# Patient Record
Sex: Male | Born: 1955 | Race: Black or African American | Hispanic: No | Marital: Single | Smoking: Current every day smoker
Health system: Southern US, Community
[De-identification: ages and names within clinical notes are randomized; demographics above are authoritative.]

## PROBLEM LIST (undated history)

## (undated) DIAGNOSIS — R634 Abnormal weight loss: Secondary | ICD-10-CM

## (undated) DIAGNOSIS — C419 Malignant neoplasm of bone and articular cartilage, unspecified: Secondary | ICD-10-CM

## (undated) DIAGNOSIS — C61 Malignant neoplasm of prostate: Secondary | ICD-10-CM

## (undated) DIAGNOSIS — E881 Lipodystrophy, not elsewhere classified: Secondary | ICD-10-CM

## (undated) DIAGNOSIS — R972 Elevated prostate specific antigen [PSA]: Secondary | ICD-10-CM

## (undated) DIAGNOSIS — T375X1A Poisoning by antiviral drugs, accidental (unintentional), initial encounter: Secondary | ICD-10-CM

## (undated) DIAGNOSIS — F32A Depression, unspecified: Secondary | ICD-10-CM

## (undated) DIAGNOSIS — E785 Hyperlipidemia, unspecified: Secondary | ICD-10-CM

## (undated) DIAGNOSIS — B20 Human immunodeficiency virus [HIV] disease: Secondary | ICD-10-CM

## (undated) DIAGNOSIS — G629 Polyneuropathy, unspecified: Secondary | ICD-10-CM

## (undated) DIAGNOSIS — F329 Major depressive disorder, single episode, unspecified: Secondary | ICD-10-CM

## (undated) HISTORY — DX: Human immunodeficiency virus (HIV) disease: B20

## (undated) HISTORY — DX: Abnormal weight loss: R63.4

## (undated) HISTORY — DX: Poisoning by antiviral drugs, accidental (unintentional), initial encounter: E88.1

## (undated) HISTORY — DX: Hyperlipidemia, unspecified: E78.5

## (undated) HISTORY — DX: Poisoning by antiviral drugs, accidental (unintentional), initial encounter: T37.5X1A

## (undated) HISTORY — DX: Major depressive disorder, single episode, unspecified: F32.9

## (undated) HISTORY — DX: Elevated prostate specific antigen (PSA): R97.20

## (undated) HISTORY — DX: Depression, unspecified: F32.A

## (undated) HISTORY — DX: Polyneuropathy, unspecified: G62.9

## (undated) HISTORY — PX: OTHER SURGICAL HISTORY: SHX169

---

## 1997-10-04 ENCOUNTER — Encounter: Admission: RE | Admit: 1997-10-04 | Discharge: 1997-10-04 | Payer: Self-pay | Admitting: Internal Medicine

## 1998-01-18 ENCOUNTER — Encounter: Admission: RE | Admit: 1998-01-18 | Discharge: 1998-01-18 | Payer: Self-pay | Admitting: Internal Medicine

## 1998-01-18 ENCOUNTER — Ambulatory Visit (HOSPITAL_COMMUNITY): Admission: RE | Admit: 1998-01-18 | Discharge: 1998-01-18 | Payer: Self-pay | Admitting: Internal Medicine

## 1998-03-09 ENCOUNTER — Encounter: Admission: RE | Admit: 1998-03-09 | Discharge: 1998-03-09 | Payer: Self-pay | Admitting: Internal Medicine

## 1998-07-14 ENCOUNTER — Ambulatory Visit (HOSPITAL_COMMUNITY): Admission: RE | Admit: 1998-07-14 | Discharge: 1998-07-14 | Payer: Self-pay | Admitting: Hematology and Oncology

## 1998-07-14 ENCOUNTER — Encounter: Admission: RE | Admit: 1998-07-14 | Discharge: 1998-07-14 | Payer: Self-pay | Admitting: Internal Medicine

## 1998-09-20 ENCOUNTER — Encounter: Admission: RE | Admit: 1998-09-20 | Discharge: 1998-09-20 | Payer: Self-pay | Admitting: Internal Medicine

## 1998-11-28 ENCOUNTER — Encounter: Admission: RE | Admit: 1998-11-28 | Discharge: 1998-11-28 | Payer: Self-pay | Admitting: Internal Medicine

## 1998-11-28 ENCOUNTER — Ambulatory Visit (HOSPITAL_COMMUNITY): Admission: RE | Admit: 1998-11-28 | Discharge: 1998-11-28 | Payer: Self-pay | Admitting: Internal Medicine

## 1999-03-27 ENCOUNTER — Ambulatory Visit (HOSPITAL_COMMUNITY): Admission: RE | Admit: 1999-03-27 | Discharge: 1999-03-27 | Payer: Self-pay | Admitting: Internal Medicine

## 1999-03-27 ENCOUNTER — Encounter: Admission: RE | Admit: 1999-03-27 | Discharge: 1999-03-27 | Payer: Self-pay | Admitting: Internal Medicine

## 1999-07-17 ENCOUNTER — Ambulatory Visit (HOSPITAL_COMMUNITY): Admission: RE | Admit: 1999-07-17 | Discharge: 1999-07-17 | Payer: Self-pay | Admitting: Hematology and Oncology

## 1999-07-17 ENCOUNTER — Encounter: Admission: RE | Admit: 1999-07-17 | Discharge: 1999-07-17 | Payer: Self-pay | Admitting: Hematology and Oncology

## 1999-08-28 ENCOUNTER — Encounter: Admission: RE | Admit: 1999-08-28 | Discharge: 1999-08-28 | Payer: Self-pay | Admitting: Internal Medicine

## 1999-09-27 ENCOUNTER — Encounter: Admission: RE | Admit: 1999-09-27 | Discharge: 1999-09-27 | Payer: Self-pay | Admitting: Internal Medicine

## 1999-09-27 ENCOUNTER — Ambulatory Visit (HOSPITAL_COMMUNITY): Admission: RE | Admit: 1999-09-27 | Discharge: 1999-09-27 | Payer: Self-pay | Admitting: Internal Medicine

## 1999-11-20 ENCOUNTER — Encounter: Admission: RE | Admit: 1999-11-20 | Discharge: 1999-11-20 | Payer: Self-pay | Admitting: Hematology and Oncology

## 2000-01-10 ENCOUNTER — Encounter: Admission: RE | Admit: 2000-01-10 | Discharge: 2000-01-10 | Payer: Self-pay | Admitting: Hematology and Oncology

## 2000-01-10 ENCOUNTER — Ambulatory Visit (HOSPITAL_COMMUNITY): Admission: RE | Admit: 2000-01-10 | Discharge: 2000-01-10 | Payer: Self-pay | Admitting: Hematology and Oncology

## 2000-03-06 ENCOUNTER — Ambulatory Visit (HOSPITAL_COMMUNITY): Admission: RE | Admit: 2000-03-06 | Discharge: 2000-03-06 | Payer: Self-pay | Admitting: Internal Medicine

## 2000-03-06 ENCOUNTER — Encounter: Admission: RE | Admit: 2000-03-06 | Discharge: 2000-03-06 | Payer: Self-pay | Admitting: Internal Medicine

## 2000-07-08 ENCOUNTER — Encounter: Admission: RE | Admit: 2000-07-08 | Discharge: 2000-07-08 | Payer: Self-pay | Admitting: Hematology and Oncology

## 2000-07-08 ENCOUNTER — Ambulatory Visit (HOSPITAL_COMMUNITY): Admission: RE | Admit: 2000-07-08 | Discharge: 2000-07-08 | Payer: Self-pay | Admitting: Hematology and Oncology

## 2000-07-30 ENCOUNTER — Encounter: Admission: RE | Admit: 2000-07-30 | Discharge: 2000-07-30 | Payer: Self-pay | Admitting: Internal Medicine

## 2000-11-25 ENCOUNTER — Encounter: Admission: RE | Admit: 2000-11-25 | Discharge: 2000-11-25 | Payer: Self-pay | Admitting: Internal Medicine

## 2000-11-25 ENCOUNTER — Ambulatory Visit (HOSPITAL_COMMUNITY): Admission: RE | Admit: 2000-11-25 | Discharge: 2000-11-25 | Payer: Self-pay | Admitting: Internal Medicine

## 2001-04-23 ENCOUNTER — Ambulatory Visit (HOSPITAL_COMMUNITY): Admission: RE | Admit: 2001-04-23 | Discharge: 2001-04-23 | Payer: Self-pay

## 2001-04-23 ENCOUNTER — Encounter: Admission: RE | Admit: 2001-04-23 | Discharge: 2001-04-23 | Payer: Self-pay

## 2001-07-20 ENCOUNTER — Ambulatory Visit (HOSPITAL_COMMUNITY): Admission: RE | Admit: 2001-07-20 | Discharge: 2001-07-20 | Payer: Self-pay | Admitting: Internal Medicine

## 2001-07-20 ENCOUNTER — Encounter: Admission: RE | Admit: 2001-07-20 | Discharge: 2001-07-20 | Payer: Self-pay | Admitting: Internal Medicine

## 2001-08-03 ENCOUNTER — Encounter: Admission: RE | Admit: 2001-08-03 | Discharge: 2001-08-03 | Payer: Self-pay | Admitting: Internal Medicine

## 2001-08-03 ENCOUNTER — Ambulatory Visit (HOSPITAL_COMMUNITY): Admission: RE | Admit: 2001-08-03 | Discharge: 2001-08-03 | Payer: Self-pay

## 2001-11-30 ENCOUNTER — Encounter: Admission: RE | Admit: 2001-11-30 | Discharge: 2001-11-30 | Payer: Self-pay | Admitting: Internal Medicine

## 2001-11-30 ENCOUNTER — Ambulatory Visit (HOSPITAL_COMMUNITY): Admission: RE | Admit: 2001-11-30 | Discharge: 2001-11-30 | Payer: Self-pay | Admitting: Internal Medicine

## 2001-12-01 ENCOUNTER — Encounter: Admission: RE | Admit: 2001-12-01 | Discharge: 2001-12-01 | Payer: Self-pay | Admitting: Internal Medicine

## 2002-03-01 ENCOUNTER — Ambulatory Visit (HOSPITAL_COMMUNITY): Admission: RE | Admit: 2002-03-01 | Discharge: 2002-03-01 | Payer: Self-pay | Admitting: Internal Medicine

## 2002-03-01 ENCOUNTER — Encounter: Admission: RE | Admit: 2002-03-01 | Discharge: 2002-03-01 | Payer: Self-pay | Admitting: Internal Medicine

## 2002-06-28 ENCOUNTER — Encounter: Admission: RE | Admit: 2002-06-28 | Discharge: 2002-06-28 | Payer: Self-pay | Admitting: Internal Medicine

## 2002-11-24 ENCOUNTER — Encounter: Admission: RE | Admit: 2002-11-24 | Discharge: 2002-11-24 | Payer: Self-pay | Admitting: Internal Medicine

## 2002-11-25 ENCOUNTER — Encounter (INDEPENDENT_AMBULATORY_CARE_PROVIDER_SITE_OTHER): Payer: Self-pay | Admitting: Internal Medicine

## 2002-11-25 ENCOUNTER — Encounter: Admission: RE | Admit: 2002-11-25 | Discharge: 2002-11-25 | Payer: Self-pay | Admitting: Internal Medicine

## 2002-11-25 ENCOUNTER — Ambulatory Visit (HOSPITAL_COMMUNITY): Admission: RE | Admit: 2002-11-25 | Discharge: 2002-11-25 | Payer: Self-pay | Admitting: Internal Medicine

## 2003-01-17 ENCOUNTER — Encounter: Admission: RE | Admit: 2003-01-17 | Discharge: 2003-01-17 | Payer: Self-pay | Admitting: Internal Medicine

## 2003-05-10 ENCOUNTER — Encounter: Admission: RE | Admit: 2003-05-10 | Discharge: 2003-05-10 | Payer: Self-pay | Admitting: Internal Medicine

## 2003-05-10 ENCOUNTER — Ambulatory Visit (HOSPITAL_COMMUNITY): Admission: RE | Admit: 2003-05-10 | Discharge: 2003-05-10 | Payer: Self-pay | Admitting: Internal Medicine

## 2003-05-12 ENCOUNTER — Encounter: Admission: RE | Admit: 2003-05-12 | Discharge: 2003-05-12 | Payer: Self-pay | Admitting: Internal Medicine

## 2003-09-13 ENCOUNTER — Encounter: Admission: RE | Admit: 2003-09-13 | Discharge: 2003-09-13 | Payer: Self-pay | Admitting: Internal Medicine

## 2003-09-13 ENCOUNTER — Ambulatory Visit (HOSPITAL_COMMUNITY): Admission: RE | Admit: 2003-09-13 | Discharge: 2003-09-13 | Payer: Self-pay | Admitting: Internal Medicine

## 2003-10-11 ENCOUNTER — Encounter: Admission: RE | Admit: 2003-10-11 | Discharge: 2003-10-11 | Payer: Self-pay | Admitting: Internal Medicine

## 2003-12-15 ENCOUNTER — Ambulatory Visit: Payer: Self-pay | Admitting: Internal Medicine

## 2003-12-15 ENCOUNTER — Ambulatory Visit (HOSPITAL_COMMUNITY): Admission: RE | Admit: 2003-12-15 | Discharge: 2003-12-15 | Payer: Self-pay | Admitting: Internal Medicine

## 2004-03-21 ENCOUNTER — Ambulatory Visit (HOSPITAL_COMMUNITY): Admission: RE | Admit: 2004-03-21 | Discharge: 2004-03-21 | Payer: Self-pay | Admitting: Internal Medicine

## 2004-03-21 ENCOUNTER — Encounter (INDEPENDENT_AMBULATORY_CARE_PROVIDER_SITE_OTHER): Payer: Self-pay | Admitting: Internal Medicine

## 2004-03-21 ENCOUNTER — Ambulatory Visit: Payer: Self-pay | Admitting: Internal Medicine

## 2005-01-29 ENCOUNTER — Ambulatory Visit (HOSPITAL_COMMUNITY): Admission: RE | Admit: 2005-01-29 | Discharge: 2005-01-29 | Payer: Self-pay | Admitting: Internal Medicine

## 2005-01-29 ENCOUNTER — Ambulatory Visit: Payer: Self-pay | Admitting: Internal Medicine

## 2005-04-30 ENCOUNTER — Ambulatory Visit: Payer: Self-pay | Admitting: Hospitalist

## 2005-04-30 ENCOUNTER — Ambulatory Visit: Payer: Self-pay | Admitting: Infectious Diseases

## 2005-04-30 ENCOUNTER — Ambulatory Visit (HOSPITAL_COMMUNITY): Admission: RE | Admit: 2005-04-30 | Discharge: 2005-04-30 | Payer: Self-pay | Admitting: Hospitalist

## 2005-10-31 ENCOUNTER — Ambulatory Visit: Payer: Self-pay | Admitting: Hospitalist

## 2005-10-31 ENCOUNTER — Encounter: Admission: RE | Admit: 2005-10-31 | Discharge: 2005-10-31 | Payer: Self-pay | Admitting: Hospitalist

## 2006-02-17 DIAGNOSIS — L738 Other specified follicular disorders: Secondary | ICD-10-CM | POA: Insufficient documentation

## 2006-02-17 DIAGNOSIS — A5139 Other secondary syphilis of skin: Secondary | ICD-10-CM

## 2006-02-17 DIAGNOSIS — B20 Human immunodeficiency virus [HIV] disease: Secondary | ICD-10-CM

## 2006-04-15 DIAGNOSIS — D649 Anemia, unspecified: Secondary | ICD-10-CM

## 2006-04-30 ENCOUNTER — Telehealth (INDEPENDENT_AMBULATORY_CARE_PROVIDER_SITE_OTHER): Payer: Self-pay | Admitting: *Deleted

## 2006-07-08 ENCOUNTER — Encounter: Admission: RE | Admit: 2006-07-08 | Discharge: 2006-07-08 | Payer: Self-pay | Admitting: Internal Medicine

## 2006-07-08 ENCOUNTER — Encounter (INDEPENDENT_AMBULATORY_CARE_PROVIDER_SITE_OTHER): Payer: Self-pay | Admitting: Internal Medicine

## 2006-07-08 ENCOUNTER — Ambulatory Visit: Payer: Self-pay | Admitting: Internal Medicine

## 2006-07-08 DIAGNOSIS — F17200 Nicotine dependence, unspecified, uncomplicated: Secondary | ICD-10-CM

## 2006-07-08 DIAGNOSIS — E785 Hyperlipidemia, unspecified: Secondary | ICD-10-CM

## 2006-07-10 ENCOUNTER — Encounter (INDEPENDENT_AMBULATORY_CARE_PROVIDER_SITE_OTHER): Payer: Self-pay | Admitting: *Deleted

## 2006-07-14 ENCOUNTER — Encounter (INDEPENDENT_AMBULATORY_CARE_PROVIDER_SITE_OTHER): Payer: Self-pay | Admitting: Internal Medicine

## 2006-07-14 ENCOUNTER — Ambulatory Visit: Payer: Self-pay | Admitting: Internal Medicine

## 2006-07-15 LAB — CONVERTED CEMR LAB: Vitamin B-12: 494 pg/mL (ref 211–911)

## 2006-07-30 ENCOUNTER — Telehealth (INDEPENDENT_AMBULATORY_CARE_PROVIDER_SITE_OTHER): Payer: Self-pay | Admitting: *Deleted

## 2006-10-27 ENCOUNTER — Telehealth (INDEPENDENT_AMBULATORY_CARE_PROVIDER_SITE_OTHER): Payer: Self-pay | Admitting: *Deleted

## 2006-10-30 ENCOUNTER — Encounter: Admission: RE | Admit: 2006-10-30 | Discharge: 2006-10-30 | Payer: Self-pay | Admitting: *Deleted

## 2006-10-30 ENCOUNTER — Ambulatory Visit: Payer: Self-pay | Admitting: *Deleted

## 2006-10-30 ENCOUNTER — Encounter (INDEPENDENT_AMBULATORY_CARE_PROVIDER_SITE_OTHER): Payer: Self-pay | Admitting: Internal Medicine

## 2006-10-30 LAB — CONVERTED CEMR LAB: HIV-1 RNA Quant, Log: <1.7 (ref <1.70)

## 2006-11-04 ENCOUNTER — Telehealth: Payer: Self-pay | Admitting: *Deleted

## 2007-01-26 ENCOUNTER — Telehealth (INDEPENDENT_AMBULATORY_CARE_PROVIDER_SITE_OTHER): Payer: Self-pay | Admitting: *Deleted

## 2007-05-06 ENCOUNTER — Ambulatory Visit: Payer: Self-pay | Admitting: Internal Medicine

## 2007-05-06 ENCOUNTER — Encounter (INDEPENDENT_AMBULATORY_CARE_PROVIDER_SITE_OTHER): Payer: Self-pay | Admitting: Internal Medicine

## 2007-05-06 ENCOUNTER — Encounter: Admission: RE | Admit: 2007-05-06 | Discharge: 2007-05-06 | Payer: Self-pay | Admitting: Internal Medicine

## 2007-05-06 LAB — CONVERTED CEMR LAB
ALT: 9 units/L (ref 0–53)
AST: 11 units/L (ref 0–37)
Albumin: 4.4 g/dL (ref 3.5–5.2)
Alkaline Phosphatase: 77 units/L (ref 39–117)
BUN: 16 mg/dL (ref 6–23)
Basophils Absolute: 0 10*3/uL (ref 0.0–0.1)
Basophils Relative: 0 % (ref 0–1)
CO2: 24 meq/L (ref 19–32)
Calcium: 9.3 mg/dL (ref 8.4–10.5)
Chloride: 107 meq/L (ref 96–112)
Creatinine, Ser: 0.87 mg/dL (ref 0.40–1.50)
Eosinophils Absolute: 0 10*3/uL (ref 0.0–0.7)
Eosinophils Relative: 1 % (ref 0–5)
Glucose, Bld: 86 mg/dL (ref 70–99)
HCT: 43 % (ref 39.0–52.0)
HIV 1 RNA Quant: 128 copies/mL — ABNORMAL HIGH (ref <50)
HIV-1 RNA Quant, Log: 2.11 — ABNORMAL HIGH (ref <1.70)
Hemoglobin: 15.3 g/dL (ref 13.0–17.0)
Lymphocytes Relative: 56 % — ABNORMAL HIGH (ref 12–46)
Lymphs Abs: 2.6 10*3/uL (ref 0.7–4.0)
MCHC: 35.6 g/dL (ref 30.0–36.0)
MCV: 109.4 fL — ABNORMAL HIGH (ref 78.0–100.0)
Monocytes Absolute: 0.6 10*3/uL (ref 0.1–1.0)
Monocytes Relative: 13 % — ABNORMAL HIGH (ref 3–12)
Neutro Abs: 1.4 10*3/uL — ABNORMAL LOW (ref 1.7–7.7)
Neutrophils Relative %: 30 % — ABNORMAL LOW (ref 43–77)
Platelets: 237 10*3/uL (ref 150–400)
Potassium: 4.2 meq/L (ref 3.5–5.3)
RBC: 3.93 M/uL — ABNORMAL LOW (ref 4.22–5.81)
RDW: 14.1 % (ref 11.5–15.5)
Sodium: 142 meq/L (ref 135–145)
Total Bilirubin: 0.6 mg/dL (ref 0.3–1.2)
Total Protein: 6.9 g/dL (ref 6.0–8.3)
WBC: 4.7 10*3/uL (ref 4.0–10.5)

## 2007-05-21 ENCOUNTER — Ambulatory Visit: Payer: Self-pay | Admitting: Internal Medicine

## 2007-05-21 ENCOUNTER — Encounter (INDEPENDENT_AMBULATORY_CARE_PROVIDER_SITE_OTHER): Payer: Self-pay | Admitting: Internal Medicine

## 2007-05-23 LAB — CONVERTED CEMR LAB
HDL: 59 mg/dL (ref >39)
Hep B S Ab: NEGATIVE
LDL Cholesterol: 109 mg/dL — ABNORMAL HIGH (ref 0–99)
Total CHOL/HDL Ratio: 3.3
Triglycerides: 145 mg/dL (ref <150)

## 2007-05-26 ENCOUNTER — Encounter (INDEPENDENT_AMBULATORY_CARE_PROVIDER_SITE_OTHER): Payer: Self-pay | Admitting: Internal Medicine

## 2007-05-29 ENCOUNTER — Ambulatory Visit: Payer: Self-pay | Admitting: Internal Medicine

## 2007-06-11 ENCOUNTER — Encounter (INDEPENDENT_AMBULATORY_CARE_PROVIDER_SITE_OTHER): Payer: Self-pay | Admitting: *Deleted

## 2007-07-14 ENCOUNTER — Encounter (INDEPENDENT_AMBULATORY_CARE_PROVIDER_SITE_OTHER): Payer: Self-pay | Admitting: Internal Medicine

## 2007-09-30 ENCOUNTER — Telehealth (INDEPENDENT_AMBULATORY_CARE_PROVIDER_SITE_OTHER): Payer: Self-pay | Admitting: Internal Medicine

## 2007-10-20 ENCOUNTER — Telehealth (INDEPENDENT_AMBULATORY_CARE_PROVIDER_SITE_OTHER): Payer: Self-pay | Admitting: Internal Medicine

## 2007-11-12 ENCOUNTER — Encounter (INDEPENDENT_AMBULATORY_CARE_PROVIDER_SITE_OTHER): Payer: Self-pay | Admitting: Internal Medicine

## 2007-11-12 ENCOUNTER — Ambulatory Visit: Payer: Self-pay | Admitting: *Deleted

## 2007-11-12 ENCOUNTER — Encounter: Admission: RE | Admit: 2007-11-12 | Discharge: 2007-11-12 | Payer: Self-pay | Admitting: *Deleted

## 2007-11-12 DIAGNOSIS — R634 Abnormal weight loss: Secondary | ICD-10-CM | POA: Insufficient documentation

## 2007-11-12 LAB — CONVERTED CEMR LAB
Alkaline Phosphatase: 79 units/L (ref 39–117)
CO2: 22 meq/L (ref 19–32)
Creatinine, Ser: 0.94 mg/dL (ref 0.40–1.50)
Eosinophils Absolute: 0 10*3/uL (ref 0.0–0.7)
Eosinophils Relative: 0 % (ref 0–5)
GC Probe Amp, Urine: NEGATIVE
Glucose, Bld: 88 mg/dL (ref 70–99)
HCT: 45.6 % (ref 39.0–52.0)
HIV 1 RNA Quant: <50 copies/mL (ref <50)
HIV-1 RNA Quant, Log: <1.7 (ref <1.70)
Hemoglobin: 15.9 g/dL (ref 13.0–17.0)
Leukocytes, UA: NEGATIVE
Lymphocytes Relative: 39 % (ref 12–46)
Lymphs Abs: 2 10*3/uL (ref 0.7–4.0)
MCV: 109.9 fL — ABNORMAL HIGH (ref 78.0–100.0)
Monocytes Absolute: 0.5 10*3/uL (ref 0.1–1.0)
Nitrite: NEGATIVE
RDW: 14.9 % (ref 11.5–15.5)
Specific Gravity, Urine: 1.034 — ABNORMAL HIGH (ref 1.005–1.03)
Total Bilirubin: 0.5 mg/dL (ref 0.3–1.2)
pH: 6 (ref 5.0–8.0)

## 2007-11-18 ENCOUNTER — Ambulatory Visit: Payer: Self-pay | Admitting: Infectious Diseases

## 2007-11-18 ENCOUNTER — Encounter (INDEPENDENT_AMBULATORY_CARE_PROVIDER_SITE_OTHER): Payer: Self-pay | Admitting: Internal Medicine

## 2007-11-19 LAB — CONVERTED CEMR LAB: Cholesterol: 222 mg/dL — ABNORMAL HIGH (ref 0–200)

## 2007-11-30 ENCOUNTER — Encounter (INDEPENDENT_AMBULATORY_CARE_PROVIDER_SITE_OTHER): Payer: Self-pay | Admitting: Internal Medicine

## 2007-11-30 ENCOUNTER — Ambulatory Visit: Payer: Self-pay | Admitting: Internal Medicine

## 2007-12-01 ENCOUNTER — Telehealth: Payer: Self-pay | Admitting: *Deleted

## 2007-12-01 LAB — CONVERTED CEMR LAB
HDL: 50 mg/dL (ref >39)
LDL Cholesterol: 83 mg/dL (ref 0–99)
Total CHOL/HDL Ratio: 3.5
Triglycerides: 222 mg/dL — ABNORMAL HIGH (ref <150)
VLDL: 44 mg/dL — ABNORMAL HIGH (ref 0–40)

## 2007-12-09 ENCOUNTER — Encounter (INDEPENDENT_AMBULATORY_CARE_PROVIDER_SITE_OTHER): Payer: Self-pay | Admitting: Internal Medicine

## 2007-12-15 ENCOUNTER — Telehealth (INDEPENDENT_AMBULATORY_CARE_PROVIDER_SITE_OTHER): Payer: Self-pay | Admitting: Internal Medicine

## 2008-01-11 ENCOUNTER — Ambulatory Visit: Payer: Self-pay | Admitting: Internal Medicine

## 2008-04-26 ENCOUNTER — Encounter (INDEPENDENT_AMBULATORY_CARE_PROVIDER_SITE_OTHER): Payer: Self-pay | Admitting: Internal Medicine

## 2008-05-03 ENCOUNTER — Encounter (INDEPENDENT_AMBULATORY_CARE_PROVIDER_SITE_OTHER): Payer: Self-pay | Admitting: Internal Medicine

## 2008-06-14 ENCOUNTER — Encounter (INDEPENDENT_AMBULATORY_CARE_PROVIDER_SITE_OTHER): Payer: Self-pay | Admitting: Internal Medicine

## 2008-06-14 ENCOUNTER — Ambulatory Visit: Payer: Self-pay | Admitting: Internal Medicine

## 2008-06-14 DIAGNOSIS — K029 Dental caries, unspecified: Secondary | ICD-10-CM | POA: Insufficient documentation

## 2008-06-14 LAB — CONVERTED CEMR LAB
ALT: 15 units/L (ref 0–53)
AST: 20 units/L (ref 0–37)
Calcium: 9.6 mg/dL (ref 8.4–10.5)
Chloride: 102 meq/L (ref 96–112)
Cocaine Metabolites: NEGATIVE
Creatinine, Ser: 0.86 mg/dL (ref 0.40–1.50)
HCT: 43.2 % (ref 39.0–52.0)
Marijuana Metabolite: POSITIVE — AB
Opiates: NEGATIVE
Phencyclidine (PCP): NEGATIVE
Platelets: 326 10*3/uL (ref 150–400)
Propoxyphene: NEGATIVE
RDW: 15.8 % — ABNORMAL HIGH (ref 11.5–15.5)

## 2008-06-20 ENCOUNTER — Encounter (INDEPENDENT_AMBULATORY_CARE_PROVIDER_SITE_OTHER): Payer: Self-pay | Admitting: Internal Medicine

## 2008-06-20 ENCOUNTER — Ambulatory Visit: Payer: Self-pay | Admitting: Internal Medicine

## 2008-06-20 LAB — CONVERTED CEMR LAB
Cholesterol: 189 mg/dL (ref 0–200)
HDL: 63 mg/dL (ref >39)
Total CHOL/HDL Ratio: 3
Triglycerides: 95 mg/dL (ref <150)
VLDL: 19 mg/dL (ref 0–40)

## 2008-07-07 ENCOUNTER — Ambulatory Visit: Payer: Self-pay | Admitting: Infectious Disease

## 2008-07-15 ENCOUNTER — Encounter (INDEPENDENT_AMBULATORY_CARE_PROVIDER_SITE_OTHER): Payer: Self-pay | Admitting: Internal Medicine

## 2008-07-20 ENCOUNTER — Encounter (INDEPENDENT_AMBULATORY_CARE_PROVIDER_SITE_OTHER): Payer: Self-pay | Admitting: Internal Medicine

## 2008-12-09 ENCOUNTER — Encounter (INDEPENDENT_AMBULATORY_CARE_PROVIDER_SITE_OTHER): Payer: Self-pay | Admitting: *Deleted

## 2009-03-09 ENCOUNTER — Encounter: Payer: Self-pay | Admitting: Infectious Disease

## 2009-04-05 ENCOUNTER — Ambulatory Visit: Payer: Self-pay | Admitting: Infectious Disease

## 2009-04-05 LAB — CONVERTED CEMR LAB
Albumin: 4.4 g/dL (ref 3.5–5.2)
Alkaline Phosphatase: 78 units/L (ref 39–117)
BUN: 19 mg/dL (ref 6–23)
Basophils Absolute: 0 10*3/uL (ref 0.0–0.1)
Basophils Relative: 0 % (ref 0–1)
CO2: 21 meq/L (ref 19–32)
Chlamydia, Swab/Urine, PCR: NEGATIVE
Cholesterol: 210 mg/dL — ABNORMAL HIGH (ref 0–200)
Eosinophils Relative: 0 % (ref 0–5)
Glucose, Bld: 87 mg/dL (ref 70–99)
HCT: 42.9 % (ref 39.0–52.0)
HDL: 58 mg/dL (ref >39)
HIV 1 RNA Quant: 90 copies/mL — ABNORMAL HIGH (ref <48)
HIV-1 RNA Quant, Log: 1.95 — ABNORMAL HIGH (ref <1.68)
Hemoglobin: 15.1 g/dL (ref 13.0–17.0)
LDL Cholesterol: 125 mg/dL — ABNORMAL HIGH (ref 0–99)
MCHC: 35.2 g/dL (ref 30.0–36.0)
Monocytes Absolute: 0.5 10*3/uL (ref 0.1–1.0)
Potassium: 4.3 meq/L (ref 3.5–5.3)
RDW: 14.4 % (ref 11.5–15.5)
Total Bilirubin: 0.5 mg/dL (ref 0.3–1.2)
Triglycerides: 134 mg/dL (ref <150)

## 2009-05-18 ENCOUNTER — Encounter (INDEPENDENT_AMBULATORY_CARE_PROVIDER_SITE_OTHER): Payer: Self-pay | Admitting: *Deleted

## 2009-06-05 ENCOUNTER — Ambulatory Visit: Payer: Self-pay | Admitting: Infectious Disease

## 2009-06-05 LAB — CONVERTED CEMR LAB
ALT: 12 units/L (ref 0–53)
BUN: 12 mg/dL (ref 6–23)
CO2: 22 meq/L (ref 19–32)
Calcium: 9.2 mg/dL (ref 8.4–10.5)
Chloride: 106 meq/L (ref 96–112)
Creatinine, Ser: 0.95 mg/dL (ref 0.40–1.50)
Eosinophils Absolute: 0 10*3/uL (ref 0.0–0.7)
Eosinophils Relative: 0 % (ref 0–5)
Glucose, Bld: 101 mg/dL — ABNORMAL HIGH (ref 70–99)
HIV 1 RNA Quant: <48 copies/mL (ref <48)
HIV-1 RNA Quant, Log: <1.68 (ref <1.68)
Hemoglobin: 15.8 g/dL (ref 13.0–17.0)
Lymphs Abs: 1.6 10*3/uL (ref 0.7–4.0)
Monocytes Relative: 9 % (ref 3–12)
RDW: 14.7 % (ref 11.5–15.5)
Testosterone: 982.33 ng/dL — ABNORMAL HIGH (ref 350–890)

## 2009-06-19 ENCOUNTER — Ambulatory Visit: Payer: Self-pay | Admitting: Infectious Disease

## 2009-06-19 DIAGNOSIS — M79609 Pain in unspecified limb: Secondary | ICD-10-CM | POA: Insufficient documentation

## 2009-07-13 ENCOUNTER — Telehealth: Payer: Self-pay | Admitting: Infectious Disease

## 2009-07-27 ENCOUNTER — Encounter: Payer: Self-pay | Admitting: Infectious Disease

## 2009-12-06 ENCOUNTER — Ambulatory Visit: Payer: Self-pay | Admitting: Infectious Disease

## 2009-12-06 LAB — CONVERTED CEMR LAB
ALT: 10 units/L (ref 0–53)
AST: 16 units/L (ref 0–37)
Albumin: 4.5 g/dL (ref 3.5–5.2)
BUN: 17 mg/dL (ref 6–23)
Basophils Relative: 0 % (ref 0–1)
Calcium: 9.4 mg/dL (ref 8.4–10.5)
Chloride: 105 meq/L (ref 96–112)
HDL: 49 mg/dL (ref >39)
HIV 1 RNA Quant: <48 copies/mL (ref <48)
HIV-1 RNA Quant, Log: <1.68 (ref <1.68)
MCHC: 35 g/dL (ref 30.0–36.0)
Monocytes Relative: 13 % — ABNORMAL HIGH (ref 3–12)
Neutro Abs: 1.6 10*3/uL — ABNORMAL LOW (ref 1.7–7.7)
Neutrophils Relative %: 36 % — ABNORMAL LOW (ref 43–77)
Potassium: 4.4 meq/L (ref 3.5–5.3)
RBC: 3.85 M/uL — ABNORMAL LOW (ref 4.22–5.81)
WBC: 4.4 10*3/uL (ref 4.0–10.5)

## 2009-12-20 ENCOUNTER — Ambulatory Visit: Payer: Self-pay | Admitting: Infectious Disease

## 2009-12-20 LAB — CONVERTED CEMR LAB: TSH: 1.606 microintl units/mL (ref 0.350–4.500)

## 2010-01-18 ENCOUNTER — Telehealth (INDEPENDENT_AMBULATORY_CARE_PROVIDER_SITE_OTHER): Payer: Self-pay | Admitting: *Deleted

## 2010-01-30 ENCOUNTER — Encounter: Payer: Self-pay | Admitting: Infectious Disease

## 2010-05-02 ENCOUNTER — Encounter (INDEPENDENT_AMBULATORY_CARE_PROVIDER_SITE_OTHER): Payer: Self-pay | Admitting: *Deleted

## 2010-05-06 LAB — CONVERTED CEMR LAB
ALT: 11 units/L (ref 0–53)
AST: 18 units/L (ref 0–37)
Basophils Absolute: 0 10*3/uL (ref 0.0–0.1)
Basophils Relative: 0 % (ref 0–1)
CO2: 22 meq/L (ref 19–32)
Chloride: 107 meq/L (ref 96–112)
Cholesterol: 196 mg/dL (ref 0–200)
Creatinine, Ser: 0.88 mg/dL (ref 0.40–1.50)
Eosinophils Relative: 0 % (ref 0–5)
HIV 1 RNA Quant: 177 copies/mL — ABNORMAL HIGH (ref <50)
HIV-1 RNA Quant, Log: 2.25 — ABNORMAL HIGH (ref <1.70)
Lymphocytes Relative: 38 % (ref 12–46)
MCHC: 34.6 g/dL (ref 30.0–36.0)
Neutro Abs: 1.7 10*3/uL (ref 1.7–7.7)
Platelets: 312 10*3/uL (ref 150–400)
RDW: 15 % — ABNORMAL HIGH (ref 11.5–14.0)
Sodium: 141 meq/L (ref 135–145)
Total Bilirubin: 0.5 mg/dL (ref 0.3–1.2)
Total CHOL/HDL Ratio: 4
Total Protein: 7.4 g/dL (ref 6.0–8.3)
VLDL: 36 mg/dL (ref 0–40)

## 2010-05-08 NOTE — Medication Information (Signed)
Summary: Tax adviser   Imported By: Florinda Marker 07/27/2009 16:46:05  _____________________________________________________________________  External Attachment:    Type:   Image     Comment:   External Document

## 2010-05-08 NOTE — Progress Notes (Signed)
Summary: Zerit refill   Phone Note From Pharmacy   Caller: [facility]  Claris Gower [phone] Summary of Call: Requesting zerit refill.     New/Updated Medications: ZERIT 40 MG CAPS (STAVUDINE) take one two times daily Prescriptions: ZERIT 40 MG CAPS (STAVUDINE) take one two times daily  #60 x 6   Entered by:   Tomasita Morrow RN   Authorized by:   Acey Lav MD   Signed by:   Tomasita Morrow RN on 07/13/2009   Method used:   Electronically to        [facility])       [address]       [place], [place]  [address]       Ph: [phone]       Fax: [phone]   RxID:   [id number]

## 2010-05-08 NOTE — Progress Notes (Signed)
Summary: Marinol Prior Approval obtained  Phone Note From Pharmacy   Caller: [facility], Prior Authorization [phone] Summary of Call: Information provided and prior authorization for Marinol approved. Jennet Maduro RN  January 19, 2010 11:32 AM

## 2010-05-08 NOTE — Assessment & Plan Note (Signed)
Summary: F/U OV/VS   Visit Type:  Follow-up Referring Provider:  gherghe Primary Provider:  Paulette Blanch Dam  CC:  f/u labs and Lipid Management.  History of Present Illness: 55 yo with HIV perfectly controlled on his current regimen of zerit, epivir, prezista and norvir. He continues to have difficulty gaining weight. I reviewed his regimen and suggested change from zerit to isentress to get him off one toxic NRTI. Otherwise he appears to be doing well.  Lipid Management History:      Positive NCEP/ATP III risk factors include male age 73 years old or older and current tobacco user.  Negative NCEP/ATP III risk factors include non-diabetic, no family history for ischemic heart disease, non-hypertensive, no ASHD (atherosclerotic heart disease), no prior stroke/TIA, no peripheral vascular disease, and no history of aortic aneurysm.    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 Take One Two Times Daily 2)  Pravachol 40 Mg Tabs (Pravastatin Sodium) .... Once Daily 3)  Multivitamins   Tabs (Multiple Vitamin) .... Take 1 Tablet By Mouth Once A Day 4)  Prezista 400 Mg Tabs (Darunavir Ethanolate) .... Take Two Prezistas With One Norvir Once A Day 5)  Norvir 100 Mg Tabs (Ritonavir) .... Take 1 Tablet By Mouth Once A Day With Two Prezistas 6)  Epivir 300 Mg Tabs (Lamivudine) .... Take 1 Tablet By Mouth Once A Day  Current Medications (verified): 1)  Pravachol 40 Mg Tabs (Pravastatin Sodium) .... Once Daily 2)  Multivitamins   Tabs (Multiple Vitamin)  .... Take 1 Tablet By Mouth Once A Day 3)  Prezista 400 Mg Tabs (Darunavir Ethanolate) .... Take Two Prezistas With One Norvir Once A Day 4)  Norvir 100 Mg Tabs (Ritonavir) .... Take 1 Tablet By Mouth Once A Day With Two Prezistas 5)  Epivir 300 Mg Tabs (Lamivudine) .... Take 1 Tablet By Mouth Once A Day 6)  Marinol 2.5 Mg Caps (Dronabinol) .... Take 1 Tablet By Mouth Two Times A Day 7)  Isentress 400 Mg Tabs (Raltegravir Potassium) .... 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  Past Surgical History: Last updated: 06/19/2009 none  Family History: Last updated: Apr 23, 2009 father died 5 months ago with metastatic prostate cancer, he had CAD but not early CAD  Social History: Last updated: 02/17/2006 Domestic Partner Current Smoker Alcohol use-no Drug use-yes (need to clarify)  Risk Factors: Alcohol Use: 0 (06/19/2009) Caffeine Use: coffee and soda (06/19/2009) Exercise: yes (06/19/2009)  Risk Factors: Smoking Status: current (06/19/2009) Packs/Day: 0.5 (06/19/2009) Passive Smoke Exposure: No (06/19/2009)  Family History: Reviewed history from 04/23/09 and no changes required. father died 5 months ago with metastatic prostate cancer, he had CAD but not early CAD  Social History: Reviewed  history from 02/17/2006 and no changes required. Domestic Partner Current Smoker Alcohol use-no Drug use-yes (need to clarify)  Review of Systems  The patient denies anorexia, fever, weight loss, weight gain, vision loss, decreased hearing, hoarseness, chest pain, syncope, dyspnea on  exertion, peripheral edema, prolonged cough, headaches, hemoptysis, abdominal pain, melena, hematochezia, severe indigestion/heartburn, hematuria, incontinence, genital sores, muscle weakness, suspicious skin lesions, transient blindness, difficulty walking, depression, unusual weight change, abnormal bleeding, and enlarged lymph nodes.    Vital Signs:  Patient profile:   55 year old male Height:      71 inches (180.34 cm) Weight:      136.50 pounds (62.05 kg) BMI:     19.11 Temp:     98.2 degrees F (36.78 degrees C) oral Pulse rate:   97 / minute BP sitting:   130 / 89  (left arm)  Vitals Entered By: Starleen Arms CMA (December 20, 2009 9:18 AM) CC: f/u labs, Lipid Management Is Patient Diabetic? No Pain Assessment Patient in pain? no      Nutritional Status BMI of 19 -24 = normal Nutritional Status Detail nl  Does patient need assistance? Functional Status Self care Ambulation Normal        Medication Adherence: 12/20/2009   Adherence to medications reviewed with patient. Counseling to provide adequate adherence provided   Prevention For Positives: 12/20/2009   Safe sex practices discussed with patient. Condoms offered.   Education Materials Provided: 12/20/2009 Safe sex practices discussed with patient. Condoms offered.                          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   Ears:  no external deformities.   Nose:  no external deformity and no external erythema.   Mouth:  pharynx pink and moist, no erythema, and no exudates.   Neck:  supple and full ROM.   Lungs:  normal respiratory effort, no crackles, and no wheezes.   Heart:  normal rate, regular rhythm, no murmur, no gallop, and no rub.   Abdomen:  soft, non-tender, normal bowel sounds, and no distention.     Extremities:  No clubbing, cyanosis, edema, or deformity noted with normal full range of motion of all joints.   Neurologic:  alert & oriented X3, strength normal in all extremities, and gait normal.   Skin:  no rashes and no ecchymoses.   Psych:  Oriented X3, memory intact for recent and remote, and normally interactive.     Impression & Recommendations:  Problem # 1:  AIDS (ICD-042)  Will change him to raltegravir, 3tc, norvir and prezista, recheck vl and cd4 in onemonth, fu in 6 months otherwise  Orders: Est. Patient Level IV (25956)  Problem # 2:  TOBACCO ABUSE (ICD-305.1)  wiil continue to try to quit  Orders: Est. Patient Level IV (38756)  Problem # 3:  HYPERLIPIDEMIA (ICD-272.4)  continue stating for now His updated medication list for this problem includes:    Pravachol 40 Mg Tabs (Pravastatin sodium) ..... Once daily  Orders: Est. Patient Level IV (43329)  Problem # 4:  WEIGHT LOSS (JJO-841.66) will rx marinol Orders: T-TSH (06301-60109) T-T4, Free (32355-73220) Est. Patient Level IV (25427)  Medications Added to Medication List This Visit: 1)  Marinol 2.5 Mg Caps (Dronabinol) .... Take  1 tablet by mouth two times a day 2)  Isentress 400 Mg Tabs (Raltegravir potassium) .... Take 1 tablet by mouth two times a day  Other Orders: Hepatitis A Vaccine (Adult Dose) (13086) Admin 1st Vaccine (57846) Flu Vaccine 42yrs + (96295) Admin of Any Addtl Vaccine (28413) Future Orders: T-CD4SP ([facility]) (CD4SP) ... 06/18/2010 T-HIV Viral Load [phone]) ... 06/18/2010 T-CBC w/Diff ([id number]) ... 06/18/2010 T-Lipid Profile [phone]) ... 06/18/2010 T-RPR (Syphilis) [phone]) ... 06/18/2010 T-Comprehensive Metabolic Panel [phone]) ... 06/18/2010 T-CD4SP ([facility]) (CD4SP) ... 01/29/2010 T-HIV Viral Load [phone]) ... 01/29/2010  Lipid Assessment/Plan:      Based on NCEP/ATP III, the patient's risk factor category is "0-1 risk factors".  The  patient's lipid goals are as follows: Total cholesterol goal is 200; LDL cholesterol goal is 160; HDL cholesterol goal is 40; Triglyceride goal is 150.  His LDL cholesterol goal has been met.    Patient Instructions: 1)  Please schedule a follow-up appointment in 6 months. 2)  Advised not to eat any food or drink any liquids after 10 PM the night before labs 3)  Be sure to return for lab work one (1) week before your next appointment as scheduled.    Immunizations Administered:  Hepatitis A Vaccine # 2:    Vaccine Type: HepA    Site: right deltoid    Mfr: GlaxoSmithKline    Dose: 0.1 ml    Route: IM    Given by: Starleen Arms CMA    Exp. Date: 03/21/2012    Lot #: [id number]    VIS given: 06/26/04 version given December 20, 2009.  Influenza Vaccine # 1:    Vaccine Type: Fluvax 3+    Site: left deltoid    Mfr: novartis    Dose: 0.5 ml    Route: IM    Given by: Starleen Arms CMA    Exp. Date: 07/08/2010    Lot #: [id number]    VIS given: 10/31/09 version given December 20, 2009.  Flu Vaccine Consent Questions:    Do you have a history of severe allergic reactions to this vaccine? no    Any prior history of allergic reactions to egg and/or gelatin? no    Do you have a sensitivity to the preservative Thimersol? no    Do you have a past history of Guillan-Barre Syndrome? no    Do you currently have an acute febrile illness? no    Have you ever had a severe reaction to latex? no    Vaccine information given and explained to patient? yes Prescriptions: ISENTRESS 400 MG TABS (RALTEGRAVIR POTASSIUM) Take 1 tablet by mouth two times a day  #60 x 11   Entered and Authorized by:   Acey Lav MD   Signed by:   Paulette Blanch Dam MD on 12/20/2009   Method used:   Print then Give to Patient   RxID:   [id number] MARINOL 2.5 MG CAPS (DRONABINOL) Take 1 tablet by mouth two times a day  #60 x 3   Entered and Authorized by:   Acey Lav MD   Signed by:   Paulette Blanch Dam MD on 12/20/2009   Method used:   Print then Give to Patient   RxID:   [id number]

## 2010-05-08 NOTE — Assessment & Plan Note (Signed)
Summary: est-ck/fu/meds/cfb   Referring Provider:  gherghe Primary Provider:  Elvera Lennox  CC:  follow-up visit, lab results, and pt c/o pain right arm and back of neck x one week.  History of Present Illness: 55 yo with HIV perfectly controlled on his current regimen of zerit, epivir, prezista and norvir. His only complaitn today was of right arm and shoulder pain which he describes as burnign 6/10 in severity in back of his arm and neck, shoulder. This happened after he was mopping. He had simialr episode after shining his car more than a year ago. Otherwise he has no complaints today.Pt with sudden pain in back of arm in triceps and shoulder.Marland Kitchen He was mopping floor Had something similar several months ago.  Preventive Screening-Counseling & Management  Alcohol-Tobacco     Alcohol drinks/day: 0     Smoking Status: current     Smoking Cessation Counseling: yes     Packs/Day: 0.5     Year Started: 20years     Passive Smoke Exposure: No  Caffeine-Diet-Exercise     Caffeine use/day: coffee and soda     Does Patient Exercise: yes     Type of exercise: WALKING     Times/week:  1-2  Comments: pt declined condoms   Current Allergies (reviewed today): No known allergies  Past History:  Past Medical History: Last updated: 02/17/2006 xerosis Condyloma latta HIV disease, last titers: 10/31/2005 CD4=500, virus<400 seborrheic dermatitis penile and scrotum rash 1995 bursitis left shoulder 1994 anorexia tobacco abuse 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 Two Times A Day 2)  Pravachol 40 Mg Tabs (Pravastatin Sodium) .... Once Daily 3)  Multivitamins   Tabs (Multiple Vitamin) .... Take 1 Tablet By Mouth Once A Day 4)  Prezista 400 Mg Tabs (Darunavir Ethanolate) .... Take Two Prezistas With One Norvir Once A Day 5)  Norvir 100 Mg Tabs (Ritonavir) .... Take 1 Tablet By Mouth Once A Day With Two Prezistas 6)  Epivir 300 Mg Tabs (Lamivudine) .... Take 1 Tablet By Mouth Once A Day  Allergies (verified): No Known Drug Allergies   Review of Systems  The patient denies anorexia, fever, weight loss, weight gain, vision loss, decreased hearing, hoarseness, chest pain, syncope, dyspnea on exertion, peripheral edema, prolonged cough, headaches, hemoptysis, abdominal pain, melena, hematochezia, severe indigestion/heartburn, hematuria, incontinence, genital sores, muscle weakness, suspicious skin lesions, transient blindness, difficulty walking, depression, unusual weight change, and abnormal bleeding.    Vital Signs:  Patient profile:   55 year old male Height:      71 inches (180.34 cm) Weight:      134.8 pounds (61.27 kg) BMI:     18.87 Temp:     97.0 degrees F (36.11 degrees C) oral Pulse rate:   90 / minute BP sitting:   126 / 80  (left arm)  Vitals Entered By: Wendall Mola CMA Duncan Dull) (June 19, 2009 10:20 AM) CC: follow-up visit, lab results, pt c/o pain right arm and back of neck x one week Is  Patient Diabetic? No Pain Assessment Patient in pain? yes     Location: right arm and back of neck Intensity: 4 Type: aching and stiff Onset of pain  Constant Nutritional Status BMI of < 19 = underweight Nutritional Status Detail normal per patient  Does patient need assistance? 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   Ears:  no external deformities.   Nose:  no external deformity and no external erythema.   Mouth:  pharynx pink and moist, no erythema, and no exudates.   Neck:  supple and full ROM.   Lungs:  normal respiratory effort, no crackles, and no wheezes.   Heart:  normal rate, regular rhythm, no murmur, no gallop, and no rub.   Abdomen:  soft, non-tender, normal bowel sounds, and no distention.   Msk:  he has minimal tenderness to palpation of his upper arm posteriorly. No bony deformities Neurologic:  alert & oriented X3, strength normal in all extremities, and gait normal.   Skin:  no rashes and no ecchymoses.   Psych:  Oriented X3, memory intact for recent and remote, and normally interactive.          Medication Adherence: 06/19/2009   Adherence to medications reviewed with patient. Counseling to provide adequate adherence provided   Prevention For Positives: 06/19/2009   Safe sex practices discussed with patient. Condoms offered.   Education Materials Provided: 06/19/2009 Safe sex practices discussed with patient. Condoms offered.                          Impression & Recommendations:  Problem # 1:  AIDS (ICD-042)  Superb control! Diagnostics Reviewed:  HIV: HIV positive - not AIDS (07/07/2008)   CD4: 410 (06/06/2009)   WBC: 4.8 (06/05/2009)   Hgb: 15.8 (06/05/2009)   HCT: 45.4 (06/05/2009)   Platelets: 288 (06/05/2009) HIV-1 RNA: <48 copies/mL (06/05/2009)     Orders: Est. Patient Level IV (16109)  Problem # 2:  ARM PAIN, RIGHT (ICD-729.5)  Seems musculoskeletal. He can try tylenol and if not relieved ibuprofen. Can also check with his pcp if persisting  Orders: Est. Patient Level IV (60454)  Problem # 3:  HYPERLIPIDEMIA  (ICD-272.4) Would like to check his lipids on his new regimen with less ritonavir His updated medication list for this problem includes:    Pravachol 40 Mg Tabs (Pravastatin sodium) ..... Once daily  Orders: Est. Patient Level IV (99214)Future Orders: T-Lipid Profile (09811-91478) ... 12/16/2009  Labs Reviewed: SGOT: 17 (06/05/2009)   SGPT: 12 (06/05/2009)  Lipid Goals: Chol Goal: 200 (04/05/2009)   HDL Goal: 40 (04/05/2009)   LDL Goal: 160 (04/05/2009)   TG Goal: 150 (04/05/2009)  Prior 10 Yr Risk Heart Disease: 9 % (04/05/2009)   HDL:58 (04/05/2009), 63 (06/20/2008)  LDL:125 (04/05/2009), 107 (06/20/2008)  Chol:210 (04/05/2009), 189 (06/20/2008)  Trig:134 (04/05/2009), 95 (06/20/2008)  Problem # 4:  TOBACCO ABUSE (ICD-305.1) Assessment: Comment Only  counselled to quit  Orders: Est. Patient Level IV (29562)  Problem # 5:  WEIGHT LOSS (ICD-783.21)  he is slowly putting some weight back on  Orders: Est. Patient Level IV (13086)  Other Orders: Hepatitis A Vaccine (Adult Dose) (57846) Admin 1st Vaccine (96295) Future Orders: T-CD4SP (  [facility]) (CD4SP) ... 12/16/2009 T-HIV Viral Load ([phone]) ... 12/16/2009 T-CBC w/Diff ([id number]) ... 12/16/2009 T-Comprehensive Metabolic Panel [phone]) ... 12/16/2009 T-RPR (Syphilis) [id number]) ... 12/16/2009    Immunizations Administered:  Hepatitis A Vaccine # 1:    Vaccine Type: HepA    Site: right deltoid    Mfr: GlaxoSmithKline    Dose: 1.0 ml    Route: IM    Given by: Wendall Mola CMA ( AAMA)    Exp. Date: 06/10/2011    Lot #: [id number] Process Orders Check Orders Results:     [facility]: ABN not required for this insurance Tests Sent for requisitioning (June 19, 2009 9:25 PM):     12/16/2009: [facility] -- T-HIV Viral Load [phone] (signed)     12/16/2009: [facility] -- T-CBC w/Diff [[id number]] (signed)     12/16/2009: [facility] -- T-Comprehensive Metabolic Panel [[id number]] (signed)     12/16/2009: [facility] -- T-Lipid Profile [phone] (signed)     12/16/2009: [facility] -- T-RPR (Syphilis) [phone] (signed)

## 2010-05-08 NOTE — Medication Information (Signed)
Summary: Pres[facility]:RX  [facility]:RX   Imported By: Florinda Marker 01/30/2010 12:06:09  _____________________________________________________________________  External Attachment:    Type:   Image     Comment:   External Document

## 2010-05-08 NOTE — Miscellaneous (Signed)
Summary: clinical update/ryan white [place] approved til 07/07/10  Clinical Lists Changes  Observations: Added new observation of AIDSDAP: Yes 2011 (05/18/2009 16:32) 

## 2010-05-10 NOTE — Miscellaneous (Signed)
  Clinical Lists Changes  Observations: Added new observation of PCTFPL: 14253.00  (05/02/2010 12:08) Added new observation of INCOMESOURCE: SSI  (05/02/2010 12:08) Added new observation of HOUSEINCOME: [id number]  (05/02/2010 12:08) Added new observation of #CHILD<18 IN: 0  (05/02/2010 12:08) Added new observation of FAMILYSIZE: 1  (05/02/2010 12:08) Added new observation of HOUSING: Stable/permanent  (05/02/2010 12:08) Added new observation of YEARLYEXPEN: 0  (05/02/2010 12:08)

## 2010-05-16 NOTE — Miscellaneous (Signed)
  Clinical Lists Changes 

## 2010-05-23 ENCOUNTER — Encounter (INDEPENDENT_AMBULATORY_CARE_PROVIDER_SITE_OTHER): Payer: Self-pay | Admitting: *Deleted

## 2010-05-30 NOTE — Miscellaneous (Signed)
  Clinical Lists Changes 

## 2010-05-30 NOTE — Miscellaneous (Signed)
Summary: ADAP UPDATE  Clinical Lists Changes  Observations: Added new observation of AIDSDAP: Pending-APPROVAL FOR 2012 (05/23/2010 13:01) Added new observation of PCTFPL: 143.37  (05/23/2010 13:01) Added new observation of HOUSEINCOME: 15176  (05/23/2010 13:01) Added new observation of FINASSESSDT: 05/23/2010  (05/23/2010 13:01)

## 2010-06-08 ENCOUNTER — Encounter (INDEPENDENT_AMBULATORY_CARE_PROVIDER_SITE_OTHER): Payer: Self-pay | Admitting: *Deleted

## 2010-06-14 NOTE — Miscellaneous (Signed)
Summary: ADAP approved til 01/06/11  Clinical Lists Changes  Observations: Added new observation of AIDSDAP: Yes 2012 (06/08/2010 9:22)

## 2010-06-22 LAB — T-HELPER CELL (CD4) - (RCID CLINIC ONLY)
CD4 % Helper T Cell: 28 % — ABNORMAL LOW (ref 33–55)
CD4 T Cell Abs: 580 uL (ref 400–2700)

## 2010-07-09 LAB — T-HELPER CELL (CD4) - (RCID CLINIC ONLY): CD4 % Helper T Cell: 25 % — ABNORMAL LOW (ref 33–55)

## 2010-07-17 ENCOUNTER — Other Ambulatory Visit: Payer: Self-pay | Admitting: Licensed Clinical Social Worker

## 2010-07-17 ENCOUNTER — Encounter: Payer: Self-pay | Admitting: Licensed Clinical Social Worker

## 2010-07-17 DIAGNOSIS — E46 Unspecified protein-calorie malnutrition: Secondary | ICD-10-CM

## 2010-07-17 DIAGNOSIS — E785 Hyperlipidemia, unspecified: Secondary | ICD-10-CM

## 2010-07-17 MED ORDER — DRONABINOL 2.5 MG PO CAPS: 2.5000 mg | ORAL_CAPSULE | Freq: Two times a day (BID) | ORAL | Status: DC

## 2010-07-17 MED ORDER — PRAVASTATIN SODIUM 40 MG PO TABS: 40.0000 mg | ORAL_TABLET | Freq: Every day | ORAL | Status: DC

## 2010-07-18 ENCOUNTER — Other Ambulatory Visit: Payer: Self-pay | Admitting: Infectious Disease

## 2010-07-18 DIAGNOSIS — E785 Hyperlipidemia, unspecified: Secondary | ICD-10-CM

## 2010-07-18 MED ORDER — PRAVASTATIN SODIUM 40 MG PO TABS: 40.0000 mg | ORAL_TABLET | Freq: Every day | ORAL | Status: DC

## 2010-09-05 ENCOUNTER — Other Ambulatory Visit (INDEPENDENT_AMBULATORY_CARE_PROVIDER_SITE_OTHER): Payer: Medicare Other

## 2010-09-05 DIAGNOSIS — B20 Human immunodeficiency virus [HIV] disease: Secondary | ICD-10-CM

## 2010-09-05 DIAGNOSIS — Z79899 Other long term (current) drug therapy: Secondary | ICD-10-CM

## 2010-09-05 DIAGNOSIS — Z113 Encounter for screening for infections with a predominantly sexual mode of transmission: Secondary | ICD-10-CM

## 2010-09-05 LAB — CBC WITH DIFFERENTIAL/PLATELET
Basophils Absolute: 0 10*3/uL (ref 0.0–0.1)
Eosinophils Relative: 0 % (ref 0–5)
Lymphocytes Relative: 41 % (ref 12–46)
MCV: 97.5 fL (ref 78.0–100.0)
Neutro Abs: 2.3 10*3/uL (ref 1.7–7.7)
Platelets: 256 10*3/uL (ref 150–400)
RDW: 14.7 % (ref 11.5–15.5)
WBC: 4.7 10*3/uL (ref 4.0–10.5)

## 2010-09-05 LAB — LIPID PANEL
HDL: 43 mg/dL (ref >39)
LDL Cholesterol: 145 mg/dL — ABNORMAL HIGH (ref 0–99)
Total CHOL/HDL Ratio: 6 Ratio

## 2010-09-05 LAB — RPR

## 2010-09-06 LAB — COMPLETE METABOLIC PANEL WITH GFR
ALT: 16 U/L (ref 0–53)
AST: 19 U/L (ref 0–37)
Alkaline Phosphatase: 82 U/L (ref 39–117)
CO2: 27 mEq/L (ref 19–32)
Creat: 1.01 mg/dL (ref 0.40–1.50)
Sodium: 138 mEq/L (ref 135–145)
Total Bilirubin: 0.3 mg/dL (ref 0.3–1.2)
Total Protein: 6.9 g/dL (ref 6.0–8.3)

## 2010-09-06 LAB — T-HELPER CELL (CD4) - (RCID CLINIC ONLY): CD4 % Helper T Cell: 25 % — ABNORMAL LOW (ref 33–55)

## 2010-09-06 LAB — HIV-1 RNA QUANT-NO REFLEX-BLD: HIV-1 RNA Quant, Log: <1.3 {Log} (ref <1.30)

## 2010-09-19 ENCOUNTER — Ambulatory Visit (INDEPENDENT_AMBULATORY_CARE_PROVIDER_SITE_OTHER): Payer: Medicare Other | Admitting: Infectious Disease

## 2010-09-19 ENCOUNTER — Encounter: Payer: Self-pay | Admitting: Infectious Disease

## 2010-09-19 VITALS — BP 150/83 | HR 65 | Temp 97.3°F | Ht 71.0 in | Wt 149.0 lb

## 2010-09-19 DIAGNOSIS — Z113 Encounter for screening for infections with a predominantly sexual mode of transmission: Secondary | ICD-10-CM

## 2010-09-19 DIAGNOSIS — E785 Hyperlipidemia, unspecified: Secondary | ICD-10-CM

## 2010-09-19 DIAGNOSIS — F17200 Nicotine dependence, unspecified, uncomplicated: Secondary | ICD-10-CM

## 2010-09-19 DIAGNOSIS — B20 Human immunodeficiency virus [HIV] disease: Secondary | ICD-10-CM

## 2010-09-19 NOTE — Assessment & Plan Note (Signed)
counselled him to quit. He does not wish for nicoderm patch or oral medication. I suggested electronic cigarette for time being to reduce ingestion of carcinogens.

## 2010-09-19 NOTE — Assessment & Plan Note (Signed)
Has actually worsened. Possibly due to weight gain. I would like to change him to a more potent statin such as lipitor or crestor. I will review his old records

## 2010-09-19 NOTE — Progress Notes (Signed)
  Subjective:    Patient ID: Dwayne Boone, male    DOB: [birth year], 55 y.o.   MRN: [id number]  HPI  55 year old AA man with history of HIV, AIDs with now with healthy CD4 and completely suppressed viral load with prezista, norvir, epivir and twice daily isentress. We reviewed all of his labs including his lipids. He has not consistenlty been taking the pravachol. He claims that he had lipitor discontinued in the past "for some reason." I spent over 45 minutes with the patient including greater than 50% of time spent counselling the patient and in coordinating his care.  Review of Systems  Constitutional: Negative for fever, chills, diaphoresis, activity change, appetite change and fatigue.  HENT: Negative for nosebleeds, congestion, rhinorrhea and tinnitus.   Eyes: Negative for photophobia and visual disturbance.  Respiratory: Negative for apnea, cough, choking and chest tightness.   Cardiovascular: Negative for chest pain and leg swelling.  Gastrointestinal: Negative for nausea, vomiting, abdominal pain, diarrhea, constipation, blood in stool and abdominal distention.  Genitourinary: Negative for dysuria and difficulty urinating.  Musculoskeletal: Negative for back pain, joint swelling, arthralgias and gait problem.  Neurological: Negative for dizziness, tremors, weakness and headaches.  Hematological: Negative for adenopathy. Does not bruise/bleed easily.  Psychiatric/Behavioral: Negative for hallucinations, behavioral problems, confusion, dysphoric mood and agitation.       Objective:   Physical Exam  Constitutional: He is oriented to person, place, and time. No distress.  HENT:  Mouth/Throat: Oropharynx is clear and moist. No oropharyngeal exudate.  Eyes: Conjunctivae and EOM are normal. Pupils are equal, round, and reactive to light. Right eye exhibits no discharge. No scleral icterus.  Neck: Normal range of motion. Neck supple.  Cardiovascular: Normal rate, regular rhythm and  normal heart sounds.  Exam reveals no gallop and no friction rub.   No murmur heard. Pulmonary/Chest: No respiratory distress. He has no wheezes. He has no rales.  Abdominal: Soft. Bowel sounds are normal. He exhibits no distension. There is no tenderness. There is no rebound.  Musculoskeletal: He exhibits no edema and no tenderness.  Lymphadenopathy:    He has no cervical adenopathy.  Neurological: He is alert and oriented to person, place, and time. He exhibits normal muscle tone. Coordination normal.  Skin: Skin is warm and dry. No rash noted. He is not diaphoretic. No erythema.  Psychiatric: He has a normal mood and affect. His behavior is normal. Judgment and thought content normal.          Assessment & Plan:  Human Immunodeficiency Virus (HIV) Disease I will review his past regimens. We may be able to simplify his regimen further  HYPERLIPIDEMIA Has actually worsened. Possibly due to weight gain. I would like to change him to a more potent statin such as lipitor or crestor. I will review his old records  TOBACCO ABUSE counselled him to quit. He does not wish for nicoderm patch or oral medication. I suggested electronic cigarette for time being to reduce ingestion of carcinogens.

## 2010-09-19 NOTE — Assessment & Plan Note (Signed)
I will review his past regimens. We may be able to simplify his regimen further

## 2010-10-18 ENCOUNTER — Ambulatory Visit: Payer: Medicare Other

## 2010-11-26 ENCOUNTER — Other Ambulatory Visit: Payer: Self-pay | Admitting: *Deleted

## 2010-11-26 DIAGNOSIS — B20 Human immunodeficiency virus [HIV] disease: Secondary | ICD-10-CM

## 2010-11-26 DIAGNOSIS — E46 Unspecified protein-calorie malnutrition: Secondary | ICD-10-CM

## 2010-11-26 MED ORDER — RALTEGRAVIR POTASSIUM 400 MG PO TABS: 400.0000 mg | ORAL_TABLET | Freq: Two times a day (BID) | ORAL | Status: DC

## 2010-11-26 MED ORDER — DRONABINOL 2.5 MG PO CAPS: 2.5000 mg | ORAL_CAPSULE | Freq: Two times a day (BID) | ORAL | Status: DC

## 2010-12-28 LAB — T-HELPER CELLS (CD4) COUNT (NOT AT ARMC): CD4 % Helper T Cell: 23 — ABNORMAL LOW

## 2011-01-21 LAB — T-HELPER CELLS (CD4) COUNT (NOT AT ARMC): CD4 % Helper T Cell: 24 — ABNORMAL LOW

## 2011-01-28 ENCOUNTER — Other Ambulatory Visit: Payer: Self-pay | Admitting: *Deleted

## 2011-01-28 DIAGNOSIS — E46 Unspecified protein-calorie malnutrition: Secondary | ICD-10-CM

## 2011-01-28 MED ORDER — DRONABINOL 2.5 MG PO CAPS: 2.5000 mg | ORAL_CAPSULE | Freq: Two times a day (BID) | ORAL | Status: DC

## 2011-01-28 NOTE — Telephone Encounter (Signed)
Marinol no longer covered by the patient's "plan" per [facility].  Prior Authorization needed

## 2011-01-28 NOTE — Telephone Encounter (Signed)
3 page prior auth forms to md for pt's marinol

## 2011-03-25 ENCOUNTER — Other Ambulatory Visit: Payer: Self-pay | Admitting: Licensed Clinical Social Worker

## 2011-03-25 ENCOUNTER — Other Ambulatory Visit (INDEPENDENT_AMBULATORY_CARE_PROVIDER_SITE_OTHER): Payer: Medicare Other

## 2011-03-25 DIAGNOSIS — B20 Human immunodeficiency virus [HIV] disease: Secondary | ICD-10-CM

## 2011-03-25 DIAGNOSIS — Z23 Encounter for immunization: Secondary | ICD-10-CM

## 2011-03-25 DIAGNOSIS — Z113 Encounter for screening for infections with a predominantly sexual mode of transmission: Secondary | ICD-10-CM

## 2011-03-25 DIAGNOSIS — E785 Hyperlipidemia, unspecified: Secondary | ICD-10-CM

## 2011-03-25 LAB — COMPLETE METABOLIC PANEL WITH GFR
AST: 20 U/L (ref 0–37)
Albumin: 4.5 g/dL (ref 3.5–5.2)
BUN: 16 mg/dL (ref 6–23)
Calcium: 9.7 mg/dL (ref 8.4–10.5)
Chloride: 105 mEq/L (ref 96–112)
Glucose, Bld: 93 mg/dL (ref 70–99)
Potassium: 4.6 mEq/L (ref 3.5–5.3)

## 2011-03-25 LAB — CBC WITH DIFFERENTIAL/PLATELET
Basophils Relative: 0 % (ref 0–1)
Eosinophils Relative: 1 % (ref 0–5)
HCT: 44.8 % (ref 39.0–52.0)
Hemoglobin: 15.5 g/dL (ref 13.0–17.0)
Lymphocytes Relative: 44 % (ref 12–46)
MCHC: 34.6 g/dL (ref 30.0–36.0)
MCV: 96.8 fL (ref 78.0–100.0)
Monocytes Absolute: 0.5 10*3/uL (ref 0.1–1.0)
Monocytes Relative: 13 % — ABNORMAL HIGH (ref 3–12)
Neutro Abs: 1.6 10*3/uL — ABNORMAL LOW (ref 1.7–7.7)

## 2011-03-25 LAB — LIPID PANEL
HDL: 47 mg/dL (ref >39)
LDL Cholesterol: 93 mg/dL (ref 0–99)
VLDL: 27 mg/dL (ref 0–40)

## 2011-03-25 LAB — RPR

## 2011-03-26 LAB — HIV-1 RNA QUANT-NO REFLEX-BLD: HIV 1 RNA Quant: 55 copies/mL — ABNORMAL HIGH (ref <20)

## 2011-03-26 LAB — T-HELPER CELL (CD4) - (RCID CLINIC ONLY): CD4 T Cell Abs: 410 uL (ref 400–2700)

## 2011-04-04 ENCOUNTER — Other Ambulatory Visit: Payer: Self-pay | Admitting: *Deleted

## 2011-04-04 DIAGNOSIS — B20 Human immunodeficiency virus [HIV] disease: Secondary | ICD-10-CM

## 2011-04-04 DIAGNOSIS — E785 Hyperlipidemia, unspecified: Secondary | ICD-10-CM

## 2011-04-04 MED ORDER — PRAVASTATIN SODIUM 40 MG PO TABS: 40.0000 mg | ORAL_TABLET | Freq: Every day | ORAL | Status: DC

## 2011-04-15 ENCOUNTER — Ambulatory Visit: Payer: Self-pay

## 2011-04-17 ENCOUNTER — Ambulatory Visit (INDEPENDENT_AMBULATORY_CARE_PROVIDER_SITE_OTHER): Payer: Medicare Other | Admitting: Infectious Disease

## 2011-04-17 ENCOUNTER — Encounter: Payer: Self-pay | Admitting: Infectious Disease

## 2011-04-17 DIAGNOSIS — Z113 Encounter for screening for infections with a predominantly sexual mode of transmission: Secondary | ICD-10-CM | POA: Diagnosis not present

## 2011-04-17 DIAGNOSIS — E785 Hyperlipidemia, unspecified: Secondary | ICD-10-CM

## 2011-04-17 DIAGNOSIS — F17200 Nicotine dependence, unspecified, uncomplicated: Secondary | ICD-10-CM | POA: Diagnosis not present

## 2011-04-17 DIAGNOSIS — B20 Human immunodeficiency virus [HIV] disease: Secondary | ICD-10-CM

## 2011-04-17 MED ORDER — DARUNAVIR ETHANOLATE 800 MG PO TABS: 800.0000 mg | ORAL_TABLET | Freq: Every day | ORAL | Status: DC

## 2011-04-17 NOTE — Progress Notes (Signed)
  Subjective:    Patient ID: Dwayne Boone, male    DOB: 1955/10/20, 56 y.o.   MRN: [id number]  HPI  56 year old AA man with history of HIV, AIDs with now with healthy CD4 and completely suppressed viral load with prezista, norvir, epivir and twice daily isentress. We reviewed all of his labs including his lipids which were at goal. He has no complaints. He is using condoms with his hIV positive male partner  Review of Systems  Constitutional: Negative for fever, chills, diaphoresis, activity change, appetite change, fatigue and unexpected weight change.  HENT: Negative for congestion, sore throat, rhinorrhea, sneezing, trouble swallowing and sinus pressure.   Eyes: Negative for photophobia and visual disturbance.  Respiratory: Negative for cough, chest tightness, shortness of breath, wheezing and stridor.   Cardiovascular: Negative for chest pain, palpitations and leg swelling.  Gastrointestinal: Negative for nausea, vomiting, abdominal pain, diarrhea, constipation, blood in stool, abdominal distention and anal bleeding.  Genitourinary: Negative for dysuria, hematuria, flank pain and difficulty urinating.  Musculoskeletal: Negative for myalgias, back pain, joint swelling, arthralgias and gait problem.  Skin: Negative for color change, pallor, rash and wound.  Neurological: Negative for dizziness, tremors, weakness and light-headedness.  Hematological: Negative for adenopathy. Does not bruise/bleed easily.  Psychiatric/Behavioral: Negative for behavioral problems, confusion, sleep disturbance, dysphoric mood, decreased concentration and agitation.       Objective:   Physical Exam  Constitutional: He is oriented to person, place, and time. He appears well-developed and well-nourished. No distress.  HENT:  Head: Normocephalic and atraumatic.  Mouth/Throat: Oropharynx is clear and moist. No oropharyngeal exudate.  Eyes: Conjunctivae and EOM are normal. Pupils are equal, round, and reactive  to light. No scleral icterus.  Neck: Normal range of motion. Neck supple. No JVD present.  Cardiovascular: Normal rate, regular rhythm and normal heart sounds.  Exam reveals no gallop and no friction rub.   No murmur heard. Pulmonary/Chest: Effort normal and breath sounds normal. No respiratory distress. He has no wheezes. He has no rales. He exhibits no tenderness.  Abdominal: He exhibits no distension and no mass. There is no tenderness. There is no rebound and no guarding.  Musculoskeletal: He exhibits no edema and no tenderness.  Lymphadenopathy:    He has no cervical adenopathy.  Neurological: He is alert and oriented to person, place, and time. He has normal reflexes. He exhibits normal muscle tone. Coordination normal.  Skin: Skin is warm and dry. He is not diaphoretic. No erythema. No pallor.  Psychiatric: He has a normal mood and affect. His behavior is normal. Judgment and thought content normal.          Assessment & Plan:  Human Immunodeficiency Virus (HIV) Disease Excellent control. Will recheck viral load just for thoroughness  TOBACCO ABUSE Quit since Apr 09, 2011  HYPERLIPIDEMIA At goal!

## 2011-04-17 NOTE — Assessment & Plan Note (Signed)
Excellent control. Will recheck viral load just for thoroughness

## 2011-04-17 NOTE — Assessment & Plan Note (Signed)
Quit since Apr 09, 2011

## 2011-04-17 NOTE — Assessment & Plan Note (Signed)
At goal.  

## 2011-05-01 ENCOUNTER — Other Ambulatory Visit: Payer: Self-pay | Admitting: *Deleted

## 2011-05-01 DIAGNOSIS — B20 Human immunodeficiency virus [HIV] disease: Secondary | ICD-10-CM

## 2011-05-01 MED ORDER — RITONAVIR 100 MG PO CAPS: 100.0000 mg | ORAL_CAPSULE | Freq: Every day | ORAL | Status: DC

## 2011-05-01 MED ORDER — LAMIVUDINE 300 MG PO TABS: 300.0000 mg | ORAL_TABLET | Freq: Every day | ORAL | Status: DC

## 2011-07-01 ENCOUNTER — Other Ambulatory Visit: Payer: Self-pay | Admitting: *Deleted

## 2011-07-01 DIAGNOSIS — E785 Hyperlipidemia, unspecified: Secondary | ICD-10-CM

## 2011-07-01 DIAGNOSIS — B20 Human immunodeficiency virus [HIV] disease: Secondary | ICD-10-CM

## 2011-07-01 MED ORDER — RALTEGRAVIR POTASSIUM 400 MG PO TABS: 400.0000 mg | ORAL_TABLET | Freq: Two times a day (BID) | ORAL | Status: DC

## 2011-07-01 MED ORDER — PRAVASTATIN SODIUM 40 MG PO TABS: 40.0000 mg | ORAL_TABLET | Freq: Every day | ORAL | Status: DC

## 2011-07-30 ENCOUNTER — Other Ambulatory Visit: Payer: Self-pay | Admitting: *Deleted

## 2011-07-30 DIAGNOSIS — E46 Unspecified protein-calorie malnutrition: Secondary | ICD-10-CM

## 2011-07-30 MED ORDER — DRONABINOL 2.5 MG PO CAPS: 2.5000 mg | ORAL_CAPSULE | Freq: Two times a day (BID) | ORAL | Status: DC

## 2011-11-26 ENCOUNTER — Other Ambulatory Visit: Payer: Self-pay | Admitting: Licensed Clinical Social Worker

## 2011-11-26 DIAGNOSIS — B20 Human immunodeficiency virus [HIV] disease: Secondary | ICD-10-CM

## 2011-11-26 DIAGNOSIS — E46 Unspecified protein-calorie malnutrition: Secondary | ICD-10-CM

## 2011-11-26 MED ORDER — DRONABINOL 2.5 MG PO CAPS: 2.5000 mg | ORAL_CAPSULE | Freq: Two times a day (BID) | ORAL | Status: DC

## 2011-11-26 MED ORDER — RITONAVIR 100 MG PO CAPS: 100.0000 mg | ORAL_CAPSULE | Freq: Every day | ORAL | Status: DC

## 2011-11-26 MED ORDER — LAMIVUDINE 300 MG PO TABS: 300.0000 mg | ORAL_TABLET | Freq: Every day | ORAL | Status: DC

## 2011-11-27 ENCOUNTER — Other Ambulatory Visit: Payer: Self-pay | Admitting: Licensed Clinical Social Worker

## 2011-11-27 DIAGNOSIS — B20 Human immunodeficiency virus [HIV] disease: Secondary | ICD-10-CM

## 2011-11-27 MED ORDER — RITONAVIR 100 MG PO TABS: 100.0000 mg | ORAL_TABLET | Freq: Every day | ORAL | Status: DC

## 2011-11-28 ENCOUNTER — Other Ambulatory Visit: Payer: Self-pay | Admitting: Infectious Disease

## 2011-11-28 DIAGNOSIS — Z113 Encounter for screening for infections with a predominantly sexual mode of transmission: Secondary | ICD-10-CM

## 2011-12-05 ENCOUNTER — Other Ambulatory Visit (INDEPENDENT_AMBULATORY_CARE_PROVIDER_SITE_OTHER): Payer: Medicare Other

## 2011-12-05 ENCOUNTER — Ambulatory Visit: Payer: Medicare Other

## 2011-12-05 DIAGNOSIS — B20 Human immunodeficiency virus [HIV] disease: Secondary | ICD-10-CM

## 2011-12-05 DIAGNOSIS — Z113 Encounter for screening for infections with a predominantly sexual mode of transmission: Secondary | ICD-10-CM

## 2011-12-05 LAB — CBC WITH DIFFERENTIAL/PLATELET
Basophils Absolute: 0 K/uL (ref 0.0–0.1)
Basophils Relative: 0 % (ref 0–1)
Eosinophils Absolute: 0.1 K/uL (ref 0.0–0.7)
Eosinophils Relative: 1 % (ref 0–5)
HCT: 43.1 % (ref 39.0–52.0)
Hemoglobin: 15.1 g/dL (ref 13.0–17.0)
Lymphocytes Relative: 44 % (ref 12–46)
Lymphs Abs: 1.9 K/uL (ref 0.7–4.0)
MCH: 33.6 pg (ref 26.0–34.0)
MCHC: 35 g/dL (ref 30.0–36.0)
MCV: 96 fL (ref 78.0–100.0)
Monocytes Absolute: 0.5 K/uL (ref 0.1–1.0)
Monocytes Relative: 11 % (ref 3–12)
Neutro Abs: 1.9 K/uL (ref 1.7–7.7)
Neutrophils Relative %: 44 % (ref 43–77)
Platelets: 268 K/uL (ref 150–400)
RBC: 4.49 MIL/uL (ref 4.22–5.81)
RDW: 15.2 % (ref 11.5–15.5)
WBC: 4.3 K/uL (ref 4.0–10.5)

## 2011-12-05 LAB — COMPLETE METABOLIC PANEL WITHOUT GFR
ALT: 17 U/L (ref 0–53)
AST: 20 U/L (ref 0–37)
Albumin: 4.3 g/dL (ref 3.5–5.2)
Alkaline Phosphatase: 89 U/L (ref 39–117)
BUN: 14 mg/dL (ref 6–23)
CO2: 24 meq/L (ref 19–32)
Calcium: 9.7 mg/dL (ref 8.4–10.5)
Chloride: 106 meq/L (ref 96–112)
Creat: 0.88 mg/dL (ref 0.50–1.35)
GFR, Est African American: >89 mL/min
GFR, Est Non African American: >89 mL/min
Glucose, Bld: 94 mg/dL (ref 70–99)
Potassium: 4.3 meq/L (ref 3.5–5.3)
Sodium: 139 meq/L (ref 135–145)
Total Bilirubin: 0.2 mg/dL — ABNORMAL LOW (ref 0.3–1.2)
Total Protein: 6.7 g/dL (ref 6.0–8.3)

## 2011-12-06 LAB — T-HELPER CELL (CD4) - (RCID CLINIC ONLY): CD4 T Cell Abs: 520 uL (ref 400–2700)

## 2011-12-08 LAB — HIV-1 RNA QUANT-NO REFLEX-BLD
HIV 1 RNA Quant: <20 copies/mL (ref <20)
HIV-1 RNA Quant, Log: <1.3 {Log} (ref <1.30)

## 2011-12-19 ENCOUNTER — Ambulatory Visit (INDEPENDENT_AMBULATORY_CARE_PROVIDER_SITE_OTHER): Payer: Medicare Other | Admitting: Infectious Disease

## 2011-12-19 ENCOUNTER — Telehealth: Payer: Self-pay | Admitting: Licensed Clinical Social Worker

## 2011-12-19 ENCOUNTER — Encounter: Payer: Self-pay | Admitting: Infectious Disease

## 2011-12-19 VITALS — BP 148/89 | HR 103 | Temp 98.2°F | Wt 155.0 lb

## 2011-12-19 DIAGNOSIS — I10 Essential (primary) hypertension: Secondary | ICD-10-CM

## 2011-12-19 DIAGNOSIS — F17200 Nicotine dependence, unspecified, uncomplicated: Secondary | ICD-10-CM | POA: Diagnosis not present

## 2011-12-19 DIAGNOSIS — Z23 Encounter for immunization: Secondary | ICD-10-CM

## 2011-12-19 DIAGNOSIS — E785 Hyperlipidemia, unspecified: Secondary | ICD-10-CM

## 2011-12-19 DIAGNOSIS — B20 Human immunodeficiency virus [HIV] disease: Secondary | ICD-10-CM

## 2011-12-19 DIAGNOSIS — A5139 Other secondary syphilis of skin: Secondary | ICD-10-CM | POA: Diagnosis not present

## 2011-12-19 DIAGNOSIS — Z113 Encounter for screening for infections with a predominantly sexual mode of transmission: Secondary | ICD-10-CM

## 2011-12-19 MED ORDER — DOLUTEGRAVIR SODIUM 50 MG PO TABS: 50.0000 mg | ORAL_TABLET | Freq: Every day | ORAL | Status: DC

## 2011-12-19 NOTE — Telephone Encounter (Signed)
Patient here for office visit, his regimen was changed to include Tivicay. The drug is not in our database at this time. I faxed the new prescription to [facility] specialty pharmacy.

## 2011-12-19 NOTE — Assessment & Plan Note (Signed)
RPR NR

## 2011-12-19 NOTE — Assessment & Plan Note (Signed)
CHANGE TO PREZISTA NORVIR, EPIVIR AND TIVICAY

## 2011-12-19 NOTE — Progress Notes (Signed)
  Subjective:    Patient ID: Dwayne Boone, male    DOB: February 15, 1956, 56 y.o.   MRN: [id number]  HPI  56 year old AA man with history of HIV, AIDs with now with healthy CD4 and completely suppressed viral load with prezista, norvir, epivir and twice daily isentress. We reviewed all of his pertinent labs. I feel that we can safely change him from his current regimen to Prezista Norvir Epivir and spell TIVICAY 50 mg daily.   Review of Systems  Constitutional: Negative for fever, chills, diaphoresis, activity change, appetite change, fatigue and unexpected weight change.  HENT: Negative for congestion, sore throat, rhinorrhea, sneezing, trouble swallowing and sinus pressure.   Eyes: Negative for photophobia and visual disturbance.  Respiratory: Negative for cough, chest tightness, shortness of breath, wheezing and stridor.   Cardiovascular: Negative for chest pain, palpitations and leg swelling.  Gastrointestinal: Negative for nausea, vomiting, abdominal pain, diarrhea, constipation, blood in stool, abdominal distention and anal bleeding.  Genitourinary: Negative for dysuria, hematuria, flank pain and difficulty urinating.  Musculoskeletal: Negative for myalgias, back pain, joint swelling, arthralgias and gait problem.  Skin: Negative for color change, pallor, rash and wound.  Neurological: Negative for dizziness, tremors, weakness and light-headedness.  Hematological: Negative for adenopathy. Does not bruise/bleed easily.  Psychiatric/Behavioral: Negative for behavioral problems, confusion, disturbed wake/sleep cycle, dysphoric mood, decreased concentration and agitation.       Objective:   Physical Exam  Constitutional: He is oriented to person, place, and time. He appears well-developed and well-nourished. No distress.  HENT:  Head: Normocephalic and atraumatic.  Mouth/Throat: Oropharynx is clear and moist. No oropharyngeal exudate.  Eyes: Conjunctivae normal and EOM are normal. Pupils  are equal, round, and reactive to light. No scleral icterus.  Neck: Normal range of motion. Neck supple. No JVD present.  Cardiovascular: Normal rate, regular rhythm and normal heart sounds.  Exam reveals no gallop and no friction rub.   No murmur heard. Pulmonary/Chest: Effort normal and breath sounds normal. No respiratory distress. He has no wheezes. He has no rales. He exhibits no tenderness.  Abdominal: He exhibits no distension and no mass. There is no tenderness. There is no rebound and no guarding.  Musculoskeletal: He exhibits no edema and no tenderness.  Lymphadenopathy:    He has no cervical adenopathy.  Neurological: He is alert and oriented to person, place, and time. He has normal reflexes. He exhibits normal muscle tone. Coordination normal.  Skin: Skin is warm and dry. He is not diaphoretic. No erythema. No pallor.  Psychiatric: He has a normal mood and affect. His behavior is normal. Judgment and thought content normal.          Assessment & Plan:  Human immunodeficiency virus (HIV) disease CHANGE TO PREZISTA NORVIR, EPIVIR AND TIVICAY  SYPHILIS, EARLY, SYMPTOMATIC, SECONDARY, SKIN RPR NR  TOBACCO ABUSE Will be trying e cigarettes

## 2011-12-19 NOTE — Assessment & Plan Note (Signed)
Asked him to keep a home BP log

## 2011-12-19 NOTE — Assessment & Plan Note (Signed)
Will be trying e cigarettes

## 2012-03-11 ENCOUNTER — Ambulatory Visit: Payer: Medicare Other | Admitting: Infectious Disease

## 2012-03-17 ENCOUNTER — Other Ambulatory Visit (INDEPENDENT_AMBULATORY_CARE_PROVIDER_SITE_OTHER): Payer: Medicare Other

## 2012-03-17 DIAGNOSIS — B20 Human immunodeficiency virus [HIV] disease: Secondary | ICD-10-CM | POA: Diagnosis not present

## 2012-03-17 DIAGNOSIS — Z113 Encounter for screening for infections with a predominantly sexual mode of transmission: Secondary | ICD-10-CM | POA: Diagnosis not present

## 2012-03-17 DIAGNOSIS — E785 Hyperlipidemia, unspecified: Secondary | ICD-10-CM

## 2012-03-17 LAB — COMPLETE METABOLIC PANEL WITH GFR
Albumin: 4.4 g/dL (ref 3.5–5.2)
Alkaline Phosphatase: 76 U/L (ref 39–117)
BUN: 17 mg/dL (ref 6–23)
Calcium: 9.3 mg/dL (ref 8.4–10.5)
Chloride: 103 mEq/L (ref 96–112)
Creat: 0.97 mg/dL (ref 0.50–1.35)
GFR, Est Non African American: 87 mL/min
Glucose, Bld: 89 mg/dL (ref 70–99)
Potassium: 3.9 mEq/L (ref 3.5–5.3)

## 2012-03-17 LAB — CBC WITH DIFFERENTIAL/PLATELET
Basophils Relative: 0 % (ref 0–1)
Eosinophils Relative: 1 % (ref 0–5)
HCT: 43.5 % (ref 39.0–52.0)
Hemoglobin: 14.8 g/dL (ref 13.0–17.0)
MCHC: 34 g/dL (ref 30.0–36.0)
MCV: 96.5 fL (ref 78.0–100.0)
Monocytes Absolute: 0.6 10*3/uL (ref 0.1–1.0)
Monocytes Relative: 11 % (ref 3–12)
Neutro Abs: 2.9 10*3/uL (ref 1.7–7.7)

## 2012-03-17 LAB — LIPID PANEL
Cholesterol: 108 mg/dL (ref 0–200)
HDL: 41 mg/dL (ref >39)
Total CHOL/HDL Ratio: 2.6 Ratio
Triglycerides: 94 mg/dL (ref <150)

## 2012-03-18 LAB — HIV-1 RNA QUANT-NO REFLEX-BLD: HIV 1 RNA Quant: <20 copies/mL (ref <20)

## 2012-03-18 LAB — RPR

## 2012-04-15 ENCOUNTER — Ambulatory Visit (INDEPENDENT_AMBULATORY_CARE_PROVIDER_SITE_OTHER): Payer: Medicare Other | Admitting: Infectious Disease

## 2012-04-15 ENCOUNTER — Encounter: Payer: Self-pay | Admitting: Infectious Disease

## 2012-04-15 VITALS — BP 114/78 | HR 90 | Temp 97.6°F | Ht 71.0 in | Wt 140.0 lb

## 2012-04-15 DIAGNOSIS — B20 Human immunodeficiency virus [HIV] disease: Secondary | ICD-10-CM | POA: Diagnosis not present

## 2012-04-15 DIAGNOSIS — R209 Unspecified disturbances of skin sensation: Secondary | ICD-10-CM

## 2012-04-15 DIAGNOSIS — E785 Hyperlipidemia, unspecified: Secondary | ICD-10-CM | POA: Diagnosis not present

## 2012-04-15 DIAGNOSIS — Z113 Encounter for screening for infections with a predominantly sexual mode of transmission: Secondary | ICD-10-CM | POA: Diagnosis not present

## 2012-04-15 DIAGNOSIS — R202 Paresthesia of skin: Secondary | ICD-10-CM

## 2012-04-15 DIAGNOSIS — E46 Unspecified protein-calorie malnutrition: Secondary | ICD-10-CM | POA: Diagnosis not present

## 2012-04-15 MED ORDER — DRONABINOL 2.5 MG PO CAPS: 2.5000 mg | ORAL_CAPSULE | Freq: Two times a day (BID) | ORAL | Status: DC

## 2012-04-15 MED ORDER — DOLUTEGRAVIR SODIUM 50 MG PO TABS: 50.0000 mg | ORAL_TABLET | Freq: Every day | ORAL | Status: DC

## 2012-04-15 NOTE — Progress Notes (Signed)
  Subjective:    Patient ID: Dwayne Boone, male    DOB: 1955/12/06, 57 y.o.   MRN: [id number]  HPI  57 year old AA man with history of HIV, AIDs and complicated ARV history with  healthy CD4 and previously  suppressed  with prezista, norvir, epivir and twice daily isentress now still suppressed on new regimen of  Prezista Norvir Epivir and spell TIVICAY 50 mg daily. He is now smoking electronic cigarettes only. He is with new complaint times 4 days of numbness in last three digits. Not aware of trauma to axilla or to hand, arm.    Review of Systems  Constitutional: Negative for fever, chills, diaphoresis, activity change, appetite change, fatigue and unexpected weight change.  HENT: Negative for congestion, sore throat, rhinorrhea, sneezing, trouble swallowing and sinus pressure.   Eyes: Negative for photophobia and visual disturbance.  Respiratory: Negative for cough, chest tightness, shortness of breath, wheezing and stridor.   Cardiovascular: Negative for chest pain, palpitations and leg swelling.  Gastrointestinal: Negative for nausea, vomiting, abdominal pain, diarrhea, constipation, blood in stool, abdominal distention and anal bleeding.  Genitourinary: Negative for dysuria, hematuria, flank pain and difficulty urinating.  Musculoskeletal: Negative for myalgias, back pain, joint swelling, arthralgias and gait problem.  Skin: Negative for color change, pallor, rash and wound.  Neurological: Positive for numbness. Negative for dizziness, tremors, weakness and light-headedness.  Hematological: Negative for adenopathy. Does not bruise/bleed easily.  Psychiatric/Behavioral: Negative for behavioral problems, confusion, sleep disturbance, dysphoric mood, decreased concentration and agitation.       Objective:   Physical Exam  Constitutional: He is oriented to person, place, and time. He appears well-developed and well-nourished. No distress.  HENT:  Head: Normocephalic and atraumatic.    Mouth/Throat: Oropharynx is clear and moist. No oropharyngeal exudate.  Eyes: Conjunctivae normal and EOM are normal. Pupils are equal, round, and reactive to light. No scleral icterus.  Neck: Normal range of motion. Neck supple. No JVD present.  Cardiovascular: Normal rate, regular rhythm and normal heart sounds.  Exam reveals no gallop and no friction rub.   No murmur heard. Pulmonary/Chest: Effort normal and breath sounds normal. No respiratory distress. He has no wheezes. He has no rales. He exhibits no tenderness.  Abdominal: He exhibits no distension and no mass. There is no tenderness. There is no rebound and no guarding.  Musculoskeletal: He exhibits no edema and no tenderness.  Lymphadenopathy:    He has no cervical adenopathy.  Neurological: He is alert and oriented to person, place, and time. He exhibits normal muscle tone. Coordination normal.       Negative Tinels' sign  Skin: Skin is warm and dry. He is not diaphoretic. No erythema. No pallor.  Psychiatric: He has a normal mood and affect. His behavior is normal. Judgment and thought content normal.          Assessment & Plan:  HIV: continue current regimen  Numbness in last three digits: wonder if could have had trauma to nerve while asleep. Will observe  Weight loss: per our charts 15# wt loss. He is out of marinol will rx again  Hyperlipidemia: LDL at goal easily

## 2012-04-28 ENCOUNTER — Other Ambulatory Visit: Payer: Self-pay | Admitting: *Deleted

## 2012-04-28 DIAGNOSIS — B20 Human immunodeficiency virus [HIV] disease: Secondary | ICD-10-CM

## 2012-04-28 MED ORDER — RITONAVIR 100 MG PO TABS: 100.0000 mg | ORAL_TABLET | Freq: Every day | ORAL | Status: DC

## 2012-04-28 MED ORDER — DARUNAVIR ETHANOLATE 800 MG PO TABS: 800.0000 mg | ORAL_TABLET | Freq: Every day | ORAL | Status: DC

## 2012-04-28 MED ORDER — LAMIVUDINE 300 MG PO TABS: 300.0000 mg | ORAL_TABLET | Freq: Every day | ORAL | Status: DC

## 2012-05-01 ENCOUNTER — Telehealth: Payer: Self-pay | Admitting: Licensed Clinical Social Worker

## 2012-05-01 ENCOUNTER — Telehealth: Payer: Self-pay | Admitting: *Deleted

## 2012-05-01 NOTE — Telephone Encounter (Signed)
This patient was denied marinol through his medicare because they would like him to be on a nutritional supplement first like ensure. Would like a prescription for ensure to send to [facility].

## 2012-05-01 NOTE — Telephone Encounter (Signed)
Faxed completed PA form with additional information to [facility] specialty pharmacy for marinol 2.5mg . Andree Coss, RN

## 2012-05-05 ENCOUNTER — Other Ambulatory Visit: Payer: Self-pay | Admitting: Licensed Clinical Social Worker

## 2012-05-05 DIAGNOSIS — E46 Unspecified protein-calorie malnutrition: Secondary | ICD-10-CM

## 2012-05-05 MED ORDER — ENSURE PO LIQD: 237.0000 mL | Freq: Two times a day (BID) | ORAL | Status: DC

## 2012-05-05 NOTE — Telephone Encounter (Signed)
That is fine 

## 2012-05-07 ENCOUNTER — Other Ambulatory Visit: Payer: Self-pay | Admitting: Licensed Clinical Social Worker

## 2012-05-12 ENCOUNTER — Ambulatory Visit: Payer: Medicare Other

## 2012-06-02 ENCOUNTER — Telehealth: Payer: Self-pay | Admitting: *Deleted

## 2012-06-02 NOTE — Telephone Encounter (Signed)
PA received from [facility] specialty pharmacy re: Marinol.  Patient currently receiving this in his monthly deliveries.  Patient will call if his delivery tomorrow does not include this medication. Andree Coss, RN

## 2012-08-03 ENCOUNTER — Other Ambulatory Visit: Payer: Self-pay | Admitting: *Deleted

## 2012-08-03 DIAGNOSIS — E785 Hyperlipidemia, unspecified: Secondary | ICD-10-CM

## 2012-08-03 MED ORDER — PRAVASTATIN SODIUM 40 MG PO TABS: 40.0000 mg | ORAL_TABLET | Freq: Every day | ORAL | Status: DC

## 2012-09-01 ENCOUNTER — Other Ambulatory Visit: Payer: Self-pay | Admitting: *Deleted

## 2012-09-01 DIAGNOSIS — E46 Unspecified protein-calorie malnutrition: Secondary | ICD-10-CM

## 2012-09-01 DIAGNOSIS — B20 Human immunodeficiency virus [HIV] disease: Secondary | ICD-10-CM

## 2012-09-01 DIAGNOSIS — E785 Hyperlipidemia, unspecified: Secondary | ICD-10-CM

## 2012-09-01 DIAGNOSIS — Z113 Encounter for screening for infections with a predominantly sexual mode of transmission: Secondary | ICD-10-CM

## 2012-09-01 MED ORDER — DRONABINOL 2.5 MG PO CAPS: 2.5000 mg | ORAL_CAPSULE | Freq: Two times a day (BID) | ORAL | Status: DC

## 2012-09-03 ENCOUNTER — Other Ambulatory Visit: Payer: Self-pay

## 2012-09-03 DIAGNOSIS — E46 Unspecified protein-calorie malnutrition: Secondary | ICD-10-CM

## 2012-09-03 DIAGNOSIS — E785 Hyperlipidemia, unspecified: Secondary | ICD-10-CM

## 2012-09-03 DIAGNOSIS — Z113 Encounter for screening for infections with a predominantly sexual mode of transmission: Secondary | ICD-10-CM

## 2012-09-03 DIAGNOSIS — B20 Human immunodeficiency virus [HIV] disease: Secondary | ICD-10-CM

## 2012-09-03 MED ORDER — DRONABINOL 2.5 MG PO CAPS: 2.5000 mg | ORAL_CAPSULE | Freq: Two times a day (BID) | ORAL | Status: DC

## 2012-12-01 ENCOUNTER — Ambulatory Visit: Payer: Medicare Other

## 2012-12-01 ENCOUNTER — Other Ambulatory Visit: Payer: Medicare Other

## 2012-12-01 DIAGNOSIS — E46 Unspecified protein-calorie malnutrition: Secondary | ICD-10-CM

## 2012-12-01 DIAGNOSIS — Z113 Encounter for screening for infections with a predominantly sexual mode of transmission: Secondary | ICD-10-CM

## 2012-12-01 DIAGNOSIS — B20 Human immunodeficiency virus [HIV] disease: Secondary | ICD-10-CM | POA: Diagnosis not present

## 2012-12-01 DIAGNOSIS — E785 Hyperlipidemia, unspecified: Secondary | ICD-10-CM

## 2012-12-01 LAB — COMPLETE METABOLIC PANEL WITH GFR
AST: 17 U/L (ref 0–37)
Albumin: 4.1 g/dL (ref 3.5–5.2)
BUN: 10 mg/dL (ref 6–23)
CO2: 26 mEq/L (ref 19–32)
Calcium: 9.2 mg/dL (ref 8.4–10.5)
Chloride: 108 mEq/L (ref 96–112)
Creat: 1.02 mg/dL (ref 0.50–1.35)
GFR, Est African American: >89 mL/min
GFR, Est Non African American: 81 mL/min
Glucose, Bld: 115 mg/dL — ABNORMAL HIGH (ref 70–99)
Potassium: 4.4 mEq/L (ref 3.5–5.3)

## 2012-12-01 LAB — CBC WITH DIFFERENTIAL/PLATELET
Basophils Absolute: 0 10*3/uL (ref 0.0–0.1)
Eosinophils Relative: 1 % (ref 0–5)
Lymphocytes Relative: 42 % (ref 12–46)
Lymphs Abs: 1.8 10*3/uL (ref 0.7–4.0)
Neutro Abs: 2 10*3/uL (ref 1.7–7.7)
Neutrophils Relative %: 47 % (ref 43–77)
Platelets: 267 10*3/uL (ref 150–400)
RBC: 4.31 MIL/uL (ref 4.22–5.81)
RDW: 15.1 % (ref 11.5–15.5)
WBC: 4.2 10*3/uL (ref 4.0–10.5)

## 2012-12-01 LAB — LIPID PANEL
Cholesterol: 146 mg/dL (ref 0–200)
HDL: 48 mg/dL (ref >39)
Total CHOL/HDL Ratio: 3 Ratio

## 2012-12-02 LAB — T-HELPER CELL (CD4) - (RCID CLINIC ONLY): CD4 T Cell Abs: 470 /uL (ref 400–2700)

## 2012-12-15 ENCOUNTER — Encounter: Payer: Self-pay | Admitting: Infectious Disease

## 2012-12-15 ENCOUNTER — Ambulatory Visit (INDEPENDENT_AMBULATORY_CARE_PROVIDER_SITE_OTHER): Payer: Medicare Other | Admitting: Infectious Disease

## 2012-12-15 VITALS — BP 143/93 | HR 71 | Temp 97.5°F | Wt 137.0 lb

## 2012-12-15 DIAGNOSIS — Z113 Encounter for screening for infections with a predominantly sexual mode of transmission: Secondary | ICD-10-CM

## 2012-12-15 DIAGNOSIS — B20 Human immunodeficiency virus [HIV] disease: Secondary | ICD-10-CM | POA: Diagnosis not present

## 2012-12-15 DIAGNOSIS — Z23 Encounter for immunization: Secondary | ICD-10-CM

## 2012-12-15 DIAGNOSIS — R634 Abnormal weight loss: Secondary | ICD-10-CM | POA: Insufficient documentation

## 2012-12-15 DIAGNOSIS — G629 Polyneuropathy, unspecified: Secondary | ICD-10-CM | POA: Insufficient documentation

## 2012-12-15 DIAGNOSIS — E785 Hyperlipidemia, unspecified: Secondary | ICD-10-CM

## 2012-12-15 DIAGNOSIS — E881 Lipodystrophy, not elsewhere classified: Secondary | ICD-10-CM | POA: Insufficient documentation

## 2012-12-15 DIAGNOSIS — E46 Unspecified protein-calorie malnutrition: Secondary | ICD-10-CM

## 2012-12-15 DIAGNOSIS — F329 Major depressive disorder, single episode, unspecified: Secondary | ICD-10-CM | POA: Insufficient documentation

## 2012-12-15 MED ORDER — TRAZODONE HCL 50 MG PO TABS: 50.0000 mg | ORAL_TABLET | Freq: Every day | ORAL | Status: DC

## 2012-12-15 MED ORDER — DOLUTEGRAVIR SODIUM 50 MG PO TABS: 50.0000 mg | ORAL_TABLET | Freq: Every day | ORAL | Status: DC

## 2012-12-15 NOTE — Progress Notes (Signed)
  Subjective:    Patient ID: Dwayne Boone, male    DOB: 21-Feb-1956, 57 y.o.   MRN: [id number]  HPI   57 year old AA man with history of HIV, AIDs and complicated ARV history with  healthy CD4 and previously  suppressed  with prezista, norvir, epivir and twice daily isentress now still suppressed on new regimen of  Prezista Norvir Epivir and  TIVICAY 50 mg daily. He is now smoking electronic cigarettes and conventional cigarettes.  Under considerable stress having to care for his mother is suffered a new stroke and is at home and in need of multiple visits from family members with the burden having fallen largely on my patient. He is open to starting antidepressant having something to help him with insomnia I proposed trazodone.    Review of Systems  Constitutional: Negative for fever, chills, diaphoresis, activity change, appetite change, fatigue and unexpected weight change.  HENT: Negative for congestion, sore throat, rhinorrhea, sneezing, trouble swallowing and sinus pressure.   Eyes: Negative for photophobia and visual disturbance.  Respiratory: Negative for cough, chest tightness, shortness of breath, wheezing and stridor.   Cardiovascular: Negative for chest pain, palpitations and leg swelling.  Gastrointestinal: Negative for nausea, vomiting, abdominal pain, diarrhea, constipation, blood in stool, abdominal distention and anal bleeding.  Genitourinary: Negative for dysuria, hematuria, flank pain and difficulty urinating.  Musculoskeletal: Negative for myalgias, back pain, joint swelling, arthralgias and gait problem.  Skin: Negative for color change, pallor, rash and wound.  Neurological: Negative for dizziness, tremors, weakness, light-headedness and numbness.  Hematological: Negative for adenopathy. Does not bruise/bleed easily.  Psychiatric/Behavioral: Positive for dysphoric mood and decreased concentration. Negative for behavioral problems, confusion, sleep disturbance and  agitation.       Objective:   Physical Exam  Constitutional: He is oriented to person, place, and time. He appears well-developed and well-nourished. No distress.  HENT:  Head: Normocephalic and atraumatic.  Mouth/Throat: Oropharynx is clear and moist. No oropharyngeal exudate.  Eyes: Conjunctivae and EOM are normal. Pupils are equal, round, and reactive to light. No scleral icterus.  Neck: Normal range of motion. Neck supple. No JVD present.  Cardiovascular: Normal rate, regular rhythm and normal heart sounds.  Exam reveals no gallop and no friction rub.   No murmur heard. Pulmonary/Chest: Effort normal and breath sounds normal. No respiratory distress. He has no wheezes. He has no rales. He exhibits no tenderness.  Abdominal: He exhibits no distension and no mass. There is no tenderness. There is no rebound and no guarding.  Musculoskeletal: He exhibits no edema and no tenderness.  Lymphadenopathy:    He has no cervical adenopathy.  Neurological: He is alert and oriented to person, place, and time. He exhibits normal muscle tone. Coordination normal.  Negative Tinels' sign  Skin: Skin is warm and dry. He is not diaphoretic. No erythema. No pallor.  Psychiatric: He has a normal mood and affect. His behavior is normal. Judgment and thought content normal.          Assessment & Plan:  HIV: continue current regimen  Hyperlipidemia: LDL at goal easily  Depression, anxiety, stress insomnia: try trazadone and rtc to see me in one aunt I also suggested he see Bernette Redbird but he was up and is seeing him yet.

## 2013-01-07 ENCOUNTER — Other Ambulatory Visit: Payer: Self-pay | Admitting: *Deleted

## 2013-01-07 DIAGNOSIS — E785 Hyperlipidemia, unspecified: Secondary | ICD-10-CM

## 2013-01-07 DIAGNOSIS — Z113 Encounter for screening for infections with a predominantly sexual mode of transmission: Secondary | ICD-10-CM

## 2013-01-07 DIAGNOSIS — B20 Human immunodeficiency virus [HIV] disease: Secondary | ICD-10-CM

## 2013-01-07 DIAGNOSIS — E46 Unspecified protein-calorie malnutrition: Secondary | ICD-10-CM

## 2013-01-07 MED ORDER — DOLUTEGRAVIR SODIUM 50 MG PO TABS: 50.0000 mg | ORAL_TABLET | Freq: Every day | ORAL | Status: DC

## 2013-01-08 ENCOUNTER — Other Ambulatory Visit: Payer: Self-pay | Admitting: Internal Medicine

## 2013-01-13 ENCOUNTER — Ambulatory Visit: Payer: Medicare Other | Admitting: Infectious Disease

## 2013-02-09 ENCOUNTER — Other Ambulatory Visit: Payer: Self-pay | Admitting: *Deleted

## 2013-02-09 DIAGNOSIS — B20 Human immunodeficiency virus [HIV] disease: Secondary | ICD-10-CM

## 2013-02-09 MED ORDER — RITONAVIR 100 MG PO TABS: 100.0000 mg | ORAL_TABLET | Freq: Every day | ORAL | Status: DC

## 2013-02-09 MED ORDER — LAMIVUDINE 300 MG PO TABS: 300.0000 mg | ORAL_TABLET | Freq: Every day | ORAL | Status: DC

## 2013-04-20 ENCOUNTER — Other Ambulatory Visit: Payer: Self-pay | Admitting: *Deleted

## 2013-04-20 DIAGNOSIS — E785 Hyperlipidemia, unspecified: Secondary | ICD-10-CM

## 2013-04-20 DIAGNOSIS — B20 Human immunodeficiency virus [HIV] disease: Secondary | ICD-10-CM

## 2013-04-20 DIAGNOSIS — E46 Unspecified protein-calorie malnutrition: Secondary | ICD-10-CM

## 2013-04-20 DIAGNOSIS — Z113 Encounter for screening for infections with a predominantly sexual mode of transmission: Secondary | ICD-10-CM

## 2013-04-20 MED ORDER — DARUNAVIR ETHANOLATE 800 MG PO TABS
800.0000 mg | ORAL_TABLET | Freq: Every day | ORAL | Status: DC
Start: 2013-04-20 — End: 2013-05-31

## 2013-04-20 NOTE — Telephone Encounter (Signed)
Marinol rx - requiring PA through [facility].  Reference # [id number].  May take up to 24 hours for medical review.  Answer will be faxed to [facility].  [phone].

## 2013-04-21 MED ORDER — DRONABINOL 2.5 MG PO CAPS
2.5000 mg | ORAL_CAPSULE | Freq: Two times a day (BID) | ORAL | Status: DC
Start: 2013-04-21 — End: 2013-11-02

## 2013-04-21 NOTE — Telephone Encounter (Signed)
Approved through 07/19/13.  Refill sent. Landis Gandy, RN

## 2013-04-21 NOTE — Addendum Note (Signed)
Addended by: Landis Gandy on: 04/21/2013 05:03 PM   Modules accepted: Orders

## 2013-05-24 ENCOUNTER — Ambulatory Visit: Payer: Self-pay | Admitting: *Deleted

## 2013-05-24 ENCOUNTER — Ambulatory Visit: Payer: Medicare Other

## 2013-05-31 ENCOUNTER — Other Ambulatory Visit: Payer: Self-pay | Admitting: *Deleted

## 2013-05-31 DIAGNOSIS — E785 Hyperlipidemia, unspecified: Secondary | ICD-10-CM

## 2013-05-31 DIAGNOSIS — E46 Unspecified protein-calorie malnutrition: Secondary | ICD-10-CM

## 2013-05-31 DIAGNOSIS — Z113 Encounter for screening for infections with a predominantly sexual mode of transmission: Secondary | ICD-10-CM

## 2013-05-31 DIAGNOSIS — B20 Human immunodeficiency virus [HIV] disease: Secondary | ICD-10-CM

## 2013-05-31 MED ORDER — DARUNAVIR ETHANOLATE 800 MG PO TABS: 800.0000 mg | ORAL_TABLET | Freq: Every day | ORAL | Status: DC

## 2013-05-31 MED ORDER — DOLUTEGRAVIR SODIUM 50 MG PO TABS: 50.0000 mg | ORAL_TABLET | Freq: Every day | ORAL | Status: DC

## 2013-05-31 MED ORDER — RITONAVIR 100 MG PO TABS: 100.0000 mg | ORAL_TABLET | Freq: Every day | ORAL | Status: DC

## 2013-05-31 MED ORDER — LAMIVUDINE 300 MG PO TABS: 300.0000 mg | ORAL_TABLET | Freq: Every day | ORAL | Status: DC

## 2013-07-13 ENCOUNTER — Other Ambulatory Visit: Payer: Medicare Other

## 2013-07-13 DIAGNOSIS — B20 Human immunodeficiency virus [HIV] disease: Secondary | ICD-10-CM | POA: Diagnosis not present

## 2013-07-13 LAB — CBC WITH DIFFERENTIAL/PLATELET
Basophils Absolute: 0 10*3/uL (ref 0.0–0.1)
Basophils Relative: 0 % (ref 0–1)
EOS ABS: 0 10*3/uL (ref 0.0–0.7)
Eosinophils Relative: 0 % (ref 0–5)
HCT: 42.3 % (ref 39.0–52.0)
HEMOGLOBIN: 14.8 g/dL (ref 13.0–17.0)
LYMPHS ABS: 1.6 10*3/uL (ref 0.7–4.0)
LYMPHS PCT: 35 % (ref 12–46)
MCH: 32.7 pg (ref 26.0–34.0)
MCHC: 35 g/dL (ref 30.0–36.0)
MCV: 93.6 fL (ref 78.0–100.0)
Monocytes Absolute: 0.6 10*3/uL (ref 0.1–1.0)
Monocytes Relative: 12 % (ref 3–12)
NEUTROS ABS: 2.4 10*3/uL (ref 1.7–7.7)
NEUTROS PCT: 53 % (ref 43–77)
Platelets: 261 10*3/uL (ref 150–400)
RBC: 4.52 MIL/uL (ref 4.22–5.81)
RDW: 15.1 % (ref 11.5–15.5)
WBC: 4.6 10*3/uL (ref 4.0–10.5)

## 2013-07-14 LAB — HIV-1 RNA QUANT-NO REFLEX-BLD
HIV 1 RNA QUANT: 25 {copies}/mL — AB (ref <20)
HIV-1 RNA Quant, Log: 1.4 {Log} — ABNORMAL HIGH (ref <1.30)

## 2013-07-14 LAB — T-HELPER CELL (CD4) - (RCID CLINIC ONLY)
CD4 % Helper T Cell: 25 % — ABNORMAL LOW (ref 33–55)
CD4 T Cell Abs: 390 /uL — ABNORMAL LOW (ref 400–2700)

## 2013-07-14 LAB — MICROALBUMIN / CREATININE URINE RATIO
Creatinine, Urine: 235.4 mg/dL
Microalb Creat Ratio: 3.3 mg/g (ref 0.0–30.0)
Microalb, Ur: 0.78 mg/dL (ref 0.00–1.89)

## 2013-07-14 LAB — COMPLETE METABOLIC PANEL WITH GFR
ALT: 13 U/L (ref 0–53)
AST: 20 U/L (ref 0–37)
Albumin: 4.1 g/dL (ref 3.5–5.2)
Alkaline Phosphatase: 69 U/L (ref 39–117)
BUN: 15 mg/dL (ref 6–23)
CO2: 26 meq/L (ref 19–32)
Calcium: 9.3 mg/dL (ref 8.4–10.5)
Chloride: 105 mEq/L (ref 96–112)
Creat: 0.91 mg/dL (ref 0.50–1.35)
GFR, Est African American: >89 mL/min
Glucose, Bld: 95 mg/dL (ref 70–99)
POTASSIUM: 4.1 meq/L (ref 3.5–5.3)
SODIUM: 139 meq/L (ref 135–145)
TOTAL PROTEIN: 6.2 g/dL (ref 6.0–8.3)
Total Bilirubin: 0.3 mg/dL (ref 0.2–1.2)

## 2013-07-28 ENCOUNTER — Ambulatory Visit (INDEPENDENT_AMBULATORY_CARE_PROVIDER_SITE_OTHER): Payer: Medicare Other | Admitting: Infectious Disease

## 2013-07-28 ENCOUNTER — Encounter: Payer: Self-pay | Admitting: Infectious Disease

## 2013-07-28 VITALS — BP 135/87 | HR 76 | Temp 97.5°F | Wt 140.0 lb

## 2013-07-28 DIAGNOSIS — Z79899 Other long term (current) drug therapy: Secondary | ICD-10-CM | POA: Diagnosis not present

## 2013-07-28 DIAGNOSIS — B20 Human immunodeficiency virus [HIV] disease: Secondary | ICD-10-CM

## 2013-07-28 DIAGNOSIS — Z113 Encounter for screening for infections with a predominantly sexual mode of transmission: Secondary | ICD-10-CM

## 2013-07-28 DIAGNOSIS — F3289 Other specified depressive episodes: Secondary | ICD-10-CM

## 2013-07-28 DIAGNOSIS — F329 Major depressive disorder, single episode, unspecified: Secondary | ICD-10-CM

## 2013-07-28 DIAGNOSIS — F32A Depression, unspecified: Secondary | ICD-10-CM

## 2013-07-28 MED ORDER — DARUNAVIR-COBICISTAT 800-150 MG PO TABS: 1.0000 | ORAL_TABLET | Freq: Every day | ORAL | Status: DC

## 2013-07-28 NOTE — Patient Instructions (Signed)
YOUR NEW REGIMEN WILL BE   PREZCOBIX 800/150MG  PINK TABLET ONCE DAILY  WITH   TIVICAY 50MG  YELLOW TABLET ONCE DAILY  WITH  LAMIVUDINE 300MG  DIAMOND SHAPED WHITE PILL ONCE DAILY

## 2013-07-28 NOTE — Progress Notes (Signed)
  Subjective:    Patient ID: Dwayne Boone, male    DOB: [birth year], 58 y.o.   MRN: [id number]  HPI   58 year old AA man with history of HIV, AIDs and complicated ARV history with  healthy CD4 and previously  suppressed  with prezista, norvir, epivir a TIVICAY 50 mg daily.     Review of Systems  Constitutional: Negative for fever, chills, diaphoresis, activity change, appetite change, fatigue and unexpected weight change.  HENT: Negative for congestion, rhinorrhea, sinus pressure, sneezing, sore throat and trouble swallowing.   Eyes: Negative for photophobia and visual disturbance.  Respiratory: Negative for cough, chest tightness, shortness of breath, wheezing and stridor.   Cardiovascular: Negative for chest pain, palpitations and leg swelling.  Gastrointestinal: Negative for nausea, vomiting, abdominal pain, diarrhea, constipation, blood in stool, abdominal distention and anal bleeding.  Genitourinary: Negative for dysuria, hematuria, flank pain and difficulty urinating.  Musculoskeletal: Negative for arthralgias, back pain, gait problem, joint swelling and myalgias.  Skin: Negative for color change, pallor, rash and wound.  Neurological: Negative for dizziness, tremors, weakness, light-headedness and numbness.  Hematological: Negative for adenopathy. Does not bruise/bleed easily.  Psychiatric/Behavioral: Positive for dysphoric mood and decreased concentration. Negative for behavioral problems, confusion, sleep disturbance and agitation.       Objective:   Physical Exam  Constitutional: He is oriented to person, place, and time. He appears well-developed and well-nourished. No distress.  HENT:  Head: Normocephalic and atraumatic.  Mouth/Throat: Oropharynx is clear and moist. No oropharyngeal exudate.  Eyes: Conjunctivae and EOM are normal. Pupils are equal, round, and reactive to light. No scleral icterus.  Neck: Normal range of motion. Neck supple. No JVD present.   Cardiovascular: Normal rate, regular rhythm and normal heart sounds.  Exam reveals no gallop and no friction rub.   No murmur heard. Pulmonary/Chest: Effort normal and breath sounds normal. No respiratory distress. He has no wheezes. He has no rales. He exhibits no tenderness.  Abdominal: He exhibits no distension and no mass. There is no tenderness. There is no rebound and no guarding.  Musculoskeletal: He exhibits no edema and no tenderness.  Lymphadenopathy:    He has no cervical adenopathy.  Neurological: He is alert and oriented to person, place, and time. He exhibits normal muscle tone. Coordination normal.  Negative Tinels' sign  Skin: Skin is warm and dry. He is not diaphoretic. No erythema. No pallor.  Psychiatric: He has a normal mood and affect. His behavior is normal. Judgment and thought content normal.          Assessment & Plan:  HIV: change to Prezcobix, Tivicay and EPIVIR  Hyperlipidemia: LDL at goal easily continue statin  Depression, anxiety, stress insomnia: try trazadone and rtc to see me in one aunt I also suggested he see Grayland Ormond but he was up and is seeing him yet.

## 2013-08-11 ENCOUNTER — Other Ambulatory Visit: Payer: Self-pay | Admitting: *Deleted

## 2013-08-11 NOTE — Telephone Encounter (Signed)
Prior Authorization for Marinol.  Call to [place] at [phone].  Completed application over the phone.  [facility] has 24 hours to respond with answer.

## 2013-08-12 NOTE — Telephone Encounter (Signed)
Received approval for 3 months - 11/11/13 from [facility].  Notified [facility], [place], [place], [place].

## 2013-08-27 ENCOUNTER — Other Ambulatory Visit: Payer: Self-pay | Admitting: Licensed Clinical Social Worker

## 2013-08-27 DIAGNOSIS — E785 Hyperlipidemia, unspecified: Secondary | ICD-10-CM

## 2013-08-27 MED ORDER — PRAVASTATIN SODIUM 40 MG PO TABS: 40.0000 mg | ORAL_TABLET | Freq: Every day | ORAL | Status: DC

## 2013-09-24 ENCOUNTER — Other Ambulatory Visit: Payer: Self-pay | Admitting: *Deleted

## 2013-09-24 DIAGNOSIS — E46 Unspecified protein-calorie malnutrition: Secondary | ICD-10-CM

## 2013-09-24 DIAGNOSIS — E785 Hyperlipidemia, unspecified: Secondary | ICD-10-CM

## 2013-09-24 DIAGNOSIS — B20 Human immunodeficiency virus [HIV] disease: Secondary | ICD-10-CM

## 2013-09-24 DIAGNOSIS — Z113 Encounter for screening for infections with a predominantly sexual mode of transmission: Secondary | ICD-10-CM

## 2013-09-24 MED ORDER — LAMIVUDINE 300 MG PO TABS: 300.0000 mg | ORAL_TABLET | Freq: Every day | ORAL | Status: DC

## 2013-09-24 MED ORDER — DOLUTEGRAVIR SODIUM 50 MG PO TABS: 50.0000 mg | ORAL_TABLET | Freq: Every day | ORAL | Status: DC

## 2013-09-29 ENCOUNTER — Other Ambulatory Visit: Payer: Self-pay | Admitting: *Deleted

## 2013-09-29 DIAGNOSIS — B20 Human immunodeficiency virus [HIV] disease: Secondary | ICD-10-CM

## 2013-09-29 MED ORDER — LAMIVUDINE 300 MG PO TABS: 300.0000 mg | ORAL_TABLET | Freq: Every day | ORAL | Status: DC

## 2013-11-02 ENCOUNTER — Other Ambulatory Visit: Payer: Self-pay | Admitting: Licensed Clinical Social Worker

## 2013-11-02 DIAGNOSIS — E46 Unspecified protein-calorie malnutrition: Secondary | ICD-10-CM

## 2013-11-02 DIAGNOSIS — B20 Human immunodeficiency virus [HIV] disease: Secondary | ICD-10-CM

## 2013-11-02 DIAGNOSIS — Z113 Encounter for screening for infections with a predominantly sexual mode of transmission: Secondary | ICD-10-CM

## 2013-11-02 DIAGNOSIS — E785 Hyperlipidemia, unspecified: Secondary | ICD-10-CM

## 2013-11-02 MED ORDER — DRONABINOL 2.5 MG PO CAPS: 2.5000 mg | ORAL_CAPSULE | Freq: Two times a day (BID) | ORAL | Status: DC

## 2013-11-04 ENCOUNTER — Ambulatory Visit: Payer: Medicare Other

## 2013-12-08 ENCOUNTER — Other Ambulatory Visit: Payer: Self-pay | Admitting: *Deleted

## 2013-12-08 NOTE — Telephone Encounter (Signed)
Prior Authorization needed for Marinol rx.  [facility] approval for 3 months only through 03/09/14.  Will need another prior authorization at that time.   PA#- [id number].

## 2014-01-06 ENCOUNTER — Other Ambulatory Visit: Payer: Self-pay | Admitting: Licensed Clinical Social Worker

## 2014-01-06 DIAGNOSIS — E785 Hyperlipidemia, unspecified: Secondary | ICD-10-CM

## 2014-01-06 DIAGNOSIS — B20 Human immunodeficiency virus [HIV] disease: Secondary | ICD-10-CM

## 2014-01-06 DIAGNOSIS — E46 Unspecified protein-calorie malnutrition: Secondary | ICD-10-CM

## 2014-01-06 DIAGNOSIS — Z113 Encounter for screening for infections with a predominantly sexual mode of transmission: Secondary | ICD-10-CM

## 2014-01-06 MED ORDER — DOLUTEGRAVIR SODIUM 50 MG PO TABS: 50.0000 mg | ORAL_TABLET | Freq: Every day | ORAL | Status: DC

## 2014-01-06 MED ORDER — DARUNAVIR-COBICISTAT 800-150 MG PO TABS: 1.0000 | ORAL_TABLET | Freq: Every day | ORAL | Status: DC

## 2014-01-13 ENCOUNTER — Telehealth: Payer: Self-pay | Admitting: *Deleted

## 2014-01-13 NOTE — Telephone Encounter (Signed)
Patient will be out of medication Monday.  His ADAP application was submitted later than intended by [facility]. Spoke with Sharyn Lull @[facility] , she is hopeful his approval will come today or Monday.  Patient advised she is looking into patient assistance for his current regimen, just in case his application is not approved before he needs medication. Pt verbalized understanding. Landis Gandy, RN

## 2014-02-24 ENCOUNTER — Other Ambulatory Visit: Payer: Self-pay | Admitting: *Deleted

## 2014-02-24 DIAGNOSIS — E46 Unspecified protein-calorie malnutrition: Secondary | ICD-10-CM

## 2014-02-24 DIAGNOSIS — Z113 Encounter for screening for infections with a predominantly sexual mode of transmission: Secondary | ICD-10-CM

## 2014-02-24 DIAGNOSIS — B20 Human immunodeficiency virus [HIV] disease: Secondary | ICD-10-CM

## 2014-02-24 DIAGNOSIS — E785 Hyperlipidemia, unspecified: Secondary | ICD-10-CM

## 2014-02-24 MED ORDER — DRONABINOL 2.5 MG PO CAPS: 2.5000 mg | ORAL_CAPSULE | Freq: Two times a day (BID) | ORAL | Status: DC

## 2014-03-08 ENCOUNTER — Other Ambulatory Visit: Payer: Self-pay | Admitting: Licensed Clinical Social Worker

## 2014-03-08 DIAGNOSIS — B20 Human immunodeficiency virus [HIV] disease: Secondary | ICD-10-CM

## 2014-03-08 DIAGNOSIS — Z113 Encounter for screening for infections with a predominantly sexual mode of transmission: Secondary | ICD-10-CM

## 2014-03-08 DIAGNOSIS — E785 Hyperlipidemia, unspecified: Secondary | ICD-10-CM

## 2014-03-08 DIAGNOSIS — E46 Unspecified protein-calorie malnutrition: Secondary | ICD-10-CM

## 2014-03-28 ENCOUNTER — Telehealth: Payer: Self-pay | Admitting: *Deleted

## 2014-03-28 NOTE — Telephone Encounter (Signed)
Marinol 2.5 mg capsule approved for non-formulary exception through 04/08/15.

## 2014-03-28 NOTE — Telephone Encounter (Signed)
Received Prior Authorization request for Marinol rx.  Called [facility] to begin authorization process. Began Prior Authorization process with [employer].  Waiting on response from application.

## 2014-03-30 NOTE — Telephone Encounter (Signed)
Informed [facility] able to deliver rx.  [facility] placed order and will deliver to pt ASAP.

## 2014-04-18 ENCOUNTER — Ambulatory Visit (INDEPENDENT_AMBULATORY_CARE_PROVIDER_SITE_OTHER): Payer: Medicare Other | Admitting: *Deleted

## 2014-04-18 ENCOUNTER — Other Ambulatory Visit (HOSPITAL_COMMUNITY)
Admission: RE | Admit: 2014-04-18 | Discharge: 2014-04-18 | Disposition: A | Discharge status: Home / Self Care | Payer: Medicare Other | Source: Ambulatory Visit | Attending: Internal Medicine | Admitting: Internal Medicine

## 2014-04-18 ENCOUNTER — Other Ambulatory Visit: Payer: Medicare Other

## 2014-04-18 DIAGNOSIS — Z113 Encounter for screening for infections with a predominantly sexual mode of transmission: Secondary | ICD-10-CM | POA: Diagnosis not present

## 2014-04-18 DIAGNOSIS — R399 Unspecified symptoms and signs involving the genitourinary system: Secondary | ICD-10-CM | POA: Diagnosis not present

## 2014-04-18 MED ORDER — SULFAMETHOXAZOLE-TRIMETHOPRIM 800-160 MG PO TABS: 1.0000 | ORAL_TABLET | Freq: Two times a day (BID) | ORAL | Status: DC

## 2014-04-18 NOTE — Progress Notes (Signed)
RN spoke with Dr. Linus Salmons.  Obtained verbal order for urinalysis w/ micro and culture, urine cytology, and rx order for Bactrim DS, take one by mouth twice daily for 3 days, #6.

## 2014-04-19 ENCOUNTER — Emergency Department (HOSPITAL_COMMUNITY)
Admission: EM | Admit: 2014-04-19 | Discharge: 2014-04-19 | Disposition: A | Discharge status: Home / Self Care | Payer: Medicare Other | Attending: Emergency Medicine | Admitting: Emergency Medicine

## 2014-04-19 ENCOUNTER — Telehealth: Payer: Self-pay | Admitting: *Deleted

## 2014-04-19 ENCOUNTER — Encounter (HOSPITAL_COMMUNITY): Payer: Self-pay

## 2014-04-19 DIAGNOSIS — Z720 Tobacco use: Secondary | ICD-10-CM | POA: Diagnosis not present

## 2014-04-19 DIAGNOSIS — R339 Retention of urine, unspecified: Secondary | ICD-10-CM | POA: Insufficient documentation

## 2014-04-19 DIAGNOSIS — B20 Human immunodeficiency virus [HIV] disease: Secondary | ICD-10-CM | POA: Insufficient documentation

## 2014-04-19 DIAGNOSIS — Z8659 Personal history of other mental and behavioral disorders: Secondary | ICD-10-CM | POA: Insufficient documentation

## 2014-04-19 DIAGNOSIS — R300 Dysuria: Secondary | ICD-10-CM | POA: Diagnosis present

## 2014-04-19 DIAGNOSIS — Z8669 Personal history of other diseases of the nervous system and sense organs: Secondary | ICD-10-CM | POA: Insufficient documentation

## 2014-04-19 DIAGNOSIS — Z79899 Other long term (current) drug therapy: Secondary | ICD-10-CM | POA: Insufficient documentation

## 2014-04-19 LAB — URINALYSIS, ROUTINE W REFLEX MICROSCOPIC
Bilirubin Urine: NEGATIVE
Bilirubin Urine: NEGATIVE
Glucose, UA: NEGATIVE mg/dL
Glucose, UA: NEGATIVE mg/dL
Hgb urine dipstick: NEGATIVE
Hgb urine dipstick: NEGATIVE
KETONES UR: NEGATIVE mg/dL
Ketones, ur: 15 mg/dL — AB
LEUKOCYTES UA: NEGATIVE
Leukocytes, UA: NEGATIVE
Nitrite: NEGATIVE
Nitrite: NEGATIVE
PH: 6 (ref 5.0–8.0)
PROTEIN: NEGATIVE mg/dL
Protein, ur: NEGATIVE mg/dL
Specific Gravity, Urine: 1.013 (ref 1.005–1.030)
Specific Gravity, Urine: 1.015 (ref 1.005–1.030)
UROBILINOGEN UA: 0.2 mg/dL (ref 0.0–1.0)
Urobilinogen, UA: 0.2 mg/dL (ref 0.0–1.0)
pH: 7 (ref 5.0–8.0)

## 2014-04-19 LAB — URINE CYTOLOGY ANCILLARY ONLY
Chlamydia: NEGATIVE
NEISSERIA GONORRHEA: NEGATIVE

## 2014-04-19 MED ORDER — TAMSULOSIN HCL 0.4 MG PO CAPS: 0.4000 mg | ORAL_CAPSULE | Freq: Every day | ORAL | Status: DC

## 2014-04-19 NOTE — ED Notes (Signed)
Pt complaining of urinary frequency and dysuria

## 2014-04-19 NOTE — ED Provider Notes (Signed)
CSN: [id number]     Arrival date & time 04/19/14  4098 History   First MD Initiated Contact with Patient 04/19/14 [phone]     Chief Complaint  Patient presents with  . Dysuria  . Urinary Frequency     (Consider location/radiation/quality/duration/timing/severity/associated sxs/prior Treatment) HPI Comments: Patient is a 59 yo M PMHx significant for AIDS presenting to the ED for urinary retention with urgency and dysuria that has been getting gradually worse over the last few days. Patient states his symptoms were worse last evening, he endorses suprapubic pain with severe urgency and decreased urine output. He states he has been on an antibiotic by his ID doctor, but does not remember why. Denies any fevers, chills, nausea, vomiting, hematuria, diarrhea, constipation, flank pain, testicular or penile pain. Denies any missed doses with his anti-retrovirals.    Past Medical History  Diagnosis Date  . AIDS   . 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 Weight loss   . Depression   . Neuropathy    Past Surgical History  Procedure Laterality Date  . None     Family History  Problem Relation Age of Onset  . CVA Mother   . Hypertension Mother    History  Substance Use Topics  . Smoking status: Current Every Day Smoker    Types: Cigarettes  . Smokeless tobacco: Not on file     Comment: 6 to 7 daily  . Alcohol Use: No    Review of Systems  Genitourinary: Positive for urgency and decreased urine volume.  All other systems reviewed and are negative.     Allergies  Review of patient's allergies indicates no known allergies.  Home Medications   Prior to Admission medications   Medication Sig Start Date End Date Taking? Authorizing Provider  darunavir-cobicistat (PREZCOBIX) 800-150 MG per tablet Take 1 tablet by mouth daily. 01/06/14   Carlyle Basques, MD  dolutegravir (TIVICAY) 50 MG tablet Take 1 tablet (50 mg total) by mouth daily. 01/06/14 01/06/43  Carlyle Basques, MD   dronabinol (MARINOL) 2.5 MG capsule Take 1 capsule (2.5 mg total) by mouth 2 (two) times daily before a meal. 02/24/14   Truman Hayward, MD  lamivudine (EPIVIR) 300 MG tablet Take 1 tablet (300 mg total) by mouth daily. 09/29/13   Truman Hayward, MD  pravastatin (PRAVACHOL) 40 MG tablet Take 1 tablet (40 mg total) by mouth daily. 08/27/13   Truman Hayward, MD  sulfamethoxazole-trimethoprim (BACTRIM DS,SEPTRA DS) 800-160 MG per tablet Take 1 tablet by mouth 2 (two) times daily. 04/18/14   Thayer Headings, MD  tamsulosin (FLOMAX) 0.4 MG CAPS capsule Take 1 capsule (0.4 mg total) by mouth daily after breakfast. 04/19/14   Anderson Malta L [facility] [place], PA-C   BP 109/69 mmHg  Pulse 69  Temp(Src) 97.5 F (36.4 C) (Oral)  Resp 18  SpO2 97% Physical Exam  Constitutional: He is oriented to person, place, and time. He appears well-developed and well-nourished. No distress.  HENT:  Head: Normocephalic and atraumatic.  Right Ear: External ear normal.  Left Ear: External ear normal.  Nose: Nose normal.  Mouth/Throat: Oropharynx is clear and moist.  Eyes: Conjunctivae are normal.  Neck: Normal range of motion. Neck supple.  Cardiovascular: Normal rate, regular rhythm and normal heart sounds.   Pulmonary/Chest: Effort normal and breath sounds normal. No respiratory distress.  Abdominal: Soft. Bowel sounds are normal. He exhibits no distension. There is no tenderness. There is no rebound and no  guarding.  Genitourinary: Rectum normal, prostate normal, testes normal and penis normal. Right testis shows no tenderness. Left testis shows no tenderness. Circumcised.  Musculoskeletal: Normal range of motion.  Lymphadenopathy:       Right: No inguinal adenopathy present.       Left: No inguinal adenopathy present.  Neurological: He is alert and oriented to person, place, and time.  Skin: Skin is warm and dry. He is not diaphoretic.  Psychiatric: He has a normal mood and affect.  Nursing note and  vitals reviewed.   ED Course  Procedures (including critical care time) Medications - No data to display  Labs Review Labs Reviewed  URINALYSIS, ROUTINE W REFLEX MICROSCOPIC - Abnormal; Notable for the following:    Ketones, ur 15 (*)    All other components within normal limits  URINE CULTURE    Imaging Review No results found.   EKG Interpretation None      MDM   Final diagnoses:  Urinary retention    Filed Vitals:   04/19/14 1107  BP: 109/69  Pulse: 69  Temp:   Resp: 18   Afebrile, NAD, non-toxic appearing, AAOx4.  I have reviewed nursing notes, vital signs, and all appropriate lab and imaging results for this patient. Abdomen soft, non-tender, non-distended. Prostate is not enlarged, boggy or tender. No testicular/penile swelling or pain. UA without evidence of infection. Urine culture sent. Symptoms improved after in and out catheterization. Patient with acute urinary retention will place on flomax with advised urology follow up. Return precautions discussed. Patient is agreeable to plan. Patient is stable at time of discharge. Patient d/w with Dr. Audie Pinto, agrees with plan.      Harlow Mares, PA-C 04/19/14 1513  Dot Lanes, MD 04/22/14 1228

## 2014-04-19 NOTE — Telephone Encounter (Signed)
Patient still having pain on urination (pain at tip of penis), frequency, difficulty passing urine.  Pt started antibiotic yesterday, was up all night trying to use the bathroom and with pain.  Not all labs are back.  Pt concerned about kidney stones. Pt does not have a PCP, but is insured.  RN spoke with Dr. Linus Salmons.   Patient is advised to go to Urgent Care for potential scan.  Pt and friend agreed.  Landis Gandy, RN

## 2014-04-19 NOTE — Discharge Instructions (Signed)
Please follow up with your primary care physician in 1-2 days. If you do not have one please call the [facility] number listed above. Please follow up with Dr. Gaynelle Arabian to schedule a follow up appointment.  Please read all discharge instructions and return precautions.    Acute Urinary Retention Acute urinary retention is the temporary inability to urinate. This is a common problem in older men. As men age their prostates become larger and block the flow of urine from the bladder. This is usually a problem that has come on gradually.  HOME CARE INSTRUCTIONS If you are sent home with a Foley catheter and a drainage system, you will need to discuss the best course of action with your health care provider. While the catheter is in, maintain a good intake of fluids. Keep the drainage bag emptied and lower than your catheter. This is so that contaminated urine will not flow back into your bladder, which could lead to a urinary tract infection. There are two main types of drainage bags. One is a large bag that usually is used at night. It has a good capacity that will allow you to sleep through the night without having to empty it. The second type is called a leg bag. It has a smaller capacity, so it needs to be emptied more frequently. However, the main advantage is that it can be attached by a leg strap and can go underneath your clothing, allowing you the freedom to move about or leave your home. Only take over-the-counter or prescription medicines for pain, discomfort, or fever as directed by your health care provider.  SEEK MEDICAL CARE IF:  You develop a low-grade fever.  You experience spasms or leakage of urine with the spasms. SEEK IMMEDIATE MEDICAL CARE IF:   You develop chills or fever.  Your catheter stops draining urine.  Your catheter falls out.  You start to develop increased bleeding that does not respond to rest and increased fluid intake. MAKE SURE  YOU:  Understand these instructions.  Will watch your condition.  Will get help right away if you are not doing well or get worse. Document Released: 07/01/2000 Document Revised: 03/30/2013 Document Reviewed: 09/03/2012 [facility] Patient Information 2015 [place], [place]. This information is not intended to replace advice given to you by your health care provider. Make sure you discuss any questions you have with your health care provider.

## 2014-04-20 ENCOUNTER — Telehealth: Payer: Self-pay | Admitting: *Deleted

## 2014-04-20 LAB — URINE CULTURE
Colony Count: NO GROWTH
Colony Count: NO GROWTH
Culture: NO GROWTH
ORGANISM ID, BACTERIA: NO GROWTH

## 2014-04-20 NOTE — Telephone Encounter (Signed)
Question about taking Flomax with his HIV rxes.  Pharmacist please call the patient to explain this "interaction."

## 2014-04-26 ENCOUNTER — Other Ambulatory Visit: Payer: Medicare Other

## 2014-04-26 DIAGNOSIS — B20 Human immunodeficiency virus [HIV] disease: Secondary | ICD-10-CM

## 2014-04-26 DIAGNOSIS — Z113 Encounter for screening for infections with a predominantly sexual mode of transmission: Secondary | ICD-10-CM | POA: Diagnosis not present

## 2014-04-26 DIAGNOSIS — Z79899 Other long term (current) drug therapy: Secondary | ICD-10-CM

## 2014-04-26 LAB — COMPLETE METABOLIC PANEL WITH GFR
ALK PHOS: 75 U/L (ref 39–117)
ALT: 22 U/L (ref 0–53)
AST: 21 U/L (ref 0–37)
Albumin: 4.2 g/dL (ref 3.5–5.2)
BUN: 17 mg/dL (ref 6–23)
CHLORIDE: 104 meq/L (ref 96–112)
CO2: 26 meq/L (ref 19–32)
Calcium: 9.1 mg/dL (ref 8.4–10.5)
Creat: 1.02 mg/dL (ref 0.50–1.35)
GFR, EST NON AFRICAN AMERICAN: 81 mL/min
GFR, Est African American: >89 mL/min
GLUCOSE: 84 mg/dL (ref 70–99)
POTASSIUM: 4.7 meq/L (ref 3.5–5.3)
Sodium: 138 mEq/L (ref 135–145)
Total Bilirubin: 0.3 mg/dL (ref 0.2–1.2)
Total Protein: 6.6 g/dL (ref 6.0–8.3)

## 2014-04-26 LAB — CBC WITH DIFFERENTIAL/PLATELET
BASOS PCT: 0 % (ref 0–1)
Basophils Absolute: 0 10*3/uL (ref 0.0–0.1)
EOS PCT: 1 % (ref 0–5)
Eosinophils Absolute: 0 10*3/uL (ref 0.0–0.7)
HEMATOCRIT: 43.2 % (ref 39.0–52.0)
Hemoglobin: 15 g/dL (ref 13.0–17.0)
LYMPHS ABS: 1.4 10*3/uL (ref 0.7–4.0)
LYMPHS PCT: 34 % (ref 12–46)
MCH: 33.7 pg (ref 26.0–34.0)
MCHC: 34.7 g/dL (ref 30.0–36.0)
MCV: 97.1 fL (ref 78.0–100.0)
MONO ABS: 0.4 10*3/uL (ref 0.1–1.0)
MPV: 8.5 fL — AB (ref 8.6–12.4)
Monocytes Relative: 10 % (ref 3–12)
Neutro Abs: 2.3 10*3/uL (ref 1.7–7.7)
Neutrophils Relative %: 55 % (ref 43–77)
Platelets: 275 10*3/uL (ref 150–400)
RBC: 4.45 MIL/uL (ref 4.22–5.81)
RDW: 14.7 % (ref 11.5–15.5)
WBC: 4.2 10*3/uL (ref 4.0–10.5)

## 2014-04-26 LAB — RPR

## 2014-04-26 LAB — LIPID PANEL
Cholesterol: 129 mg/dL (ref 0–200)
HDL: 43 mg/dL (ref >39)
LDL Cholesterol: 68 mg/dL (ref 0–99)
Total CHOL/HDL Ratio: 3 Ratio
Triglycerides: 88 mg/dL (ref <150)
VLDL: 18 mg/dL (ref 0–40)

## 2014-04-27 LAB — T-HELPER CELL (CD4) - (RCID CLINIC ONLY)
CD4 T CELL HELPER: 30 % — AB (ref 33–55)
CD4 T Cell Abs: 460 /uL (ref 400–2700)

## 2014-04-27 LAB — HIV-1 RNA QUANT-NO REFLEX-BLD: HIV-1 RNA Quant, Log: <1.3 {Log} (ref <1.30)

## 2014-05-02 ENCOUNTER — Other Ambulatory Visit: Payer: Self-pay | Admitting: *Deleted

## 2014-05-02 ENCOUNTER — Telehealth: Payer: Self-pay | Admitting: *Deleted

## 2014-05-02 MED ORDER — TAMSULOSIN HCL 0.4 MG PO CAPS: 0.4000 mg | ORAL_CAPSULE | Freq: Every day | ORAL | Status: DC

## 2014-05-02 NOTE — Telephone Encounter (Signed)
Patient advised he was given Flomax at the ED 04/20/14 (15 day supply) and he wants to know if he is suppose to continue to take the medication beyond the 15 days. Advised the patient will ask the doctor and call him back because we did not put him on the medication and there are no notes about it in the system.  Patient reports not having any more of the symptoms he had that sent him to the ED everything back to normal.

## 2014-05-02 NOTE — Telephone Encounter (Signed)
Patient advised he was having pain a pressure and going every 5 minutes. He advised he feels much better now. He asked if he feels better because of the medication and if that will change if he stops. Advised him not sure but will ask the doctor that as well. Advised him will call him back once I let the doctor know

## 2014-05-02 NOTE — Telephone Encounter (Signed)
Can he give [place] reason he went to Emergency dept, what kind of ssx he was having  And if they are better on the flomax?

## 2014-05-02 NOTE — Telephone Encounter (Signed)
I would continue the flomax

## 2014-05-02 NOTE — Telephone Encounter (Signed)
Per Dr Tommy Medal until the patient has a visit 05/18/14 and they can discuss symptoms and future refills at that visit.

## 2014-05-04 ENCOUNTER — Ambulatory Visit: Payer: Medicare Other

## 2014-05-06 ENCOUNTER — Other Ambulatory Visit: Payer: Self-pay | Admitting: *Deleted

## 2014-05-06 DIAGNOSIS — E785 Hyperlipidemia, unspecified: Secondary | ICD-10-CM

## 2014-05-06 DIAGNOSIS — Z113 Encounter for screening for infections with a predominantly sexual mode of transmission: Secondary | ICD-10-CM

## 2014-05-06 DIAGNOSIS — B20 Human immunodeficiency virus [HIV] disease: Secondary | ICD-10-CM

## 2014-05-06 DIAGNOSIS — E46 Unspecified protein-calorie malnutrition: Secondary | ICD-10-CM

## 2014-05-06 MED ORDER — LAMIVUDINE 300 MG PO TABS: 300.0000 mg | ORAL_TABLET | Freq: Every day | ORAL | Status: DC

## 2014-05-06 MED ORDER — DARUNAVIR-COBICISTAT 800-150 MG PO TABS: 1.0000 | ORAL_TABLET | Freq: Every day | ORAL | Status: DC

## 2014-05-06 MED ORDER — DOLUTEGRAVIR SODIUM 50 MG PO TABS
50.0000 mg | ORAL_TABLET | Freq: Every day | ORAL | Status: DC
Start: 2014-05-06 — End: 2014-11-09

## 2014-05-18 ENCOUNTER — Ambulatory Visit (INDEPENDENT_AMBULATORY_CARE_PROVIDER_SITE_OTHER): Payer: Medicare Other | Admitting: Infectious Disease

## 2014-05-18 ENCOUNTER — Encounter: Payer: Self-pay | Admitting: Infectious Disease

## 2014-05-18 VITALS — BP 117/70 | HR 92 | Temp 97.9°F | Wt 139.0 lb

## 2014-05-18 DIAGNOSIS — Z23 Encounter for immunization: Secondary | ICD-10-CM | POA: Diagnosis not present

## 2014-05-18 DIAGNOSIS — N400 Enlarged prostate without lower urinary tract symptoms: Secondary | ICD-10-CM | POA: Diagnosis not present

## 2014-05-18 DIAGNOSIS — Z720 Tobacco use: Secondary | ICD-10-CM

## 2014-05-18 DIAGNOSIS — E785 Hyperlipidemia, unspecified: Secondary | ICD-10-CM | POA: Diagnosis not present

## 2014-05-18 DIAGNOSIS — F17200 Nicotine dependence, unspecified, uncomplicated: Secondary | ICD-10-CM

## 2014-05-18 DIAGNOSIS — F329 Major depressive disorder, single episode, unspecified: Secondary | ICD-10-CM

## 2014-05-18 DIAGNOSIS — F17210 Nicotine dependence, cigarettes, uncomplicated: Secondary | ICD-10-CM | POA: Diagnosis not present

## 2014-05-18 DIAGNOSIS — F32A Depression, unspecified: Secondary | ICD-10-CM

## 2014-05-18 DIAGNOSIS — B20 Human immunodeficiency virus [HIV] disease: Secondary | ICD-10-CM | POA: Diagnosis present

## 2014-05-18 MED ORDER — VARENICLINE TARTRATE 1 MG PO TABS: 1.0000 mg | ORAL_TABLET | Freq: Two times a day (BID) | ORAL | Status: DC

## 2014-05-18 MED ORDER — VARENICLINE TARTRATE 0.5 MG X 11 & 1 MG X 42 PO MISC: ORAL | Status: DC

## 2014-05-18 NOTE — Progress Notes (Signed)
  Subjective:    Patient ID: Dwayne Boone, male    DOB: 02-Aug-1955, 59 y.o.   MRN: [id number]  HPI   59 year old AA man with history of HIV, AIDs and complicated ARV history with  healthy CD4 and previously  suppressed  with prezista, norvir, epivir a TIVICAY 50 mg daily.   Lab Results  Component Value Date   HIV1RNAQUANT <20 04/26/2014   Lab Results  Component Value Date   CD4TABS 460 04/26/2014   CD4TABS 390* 07/13/2013   CD4TABS 470 12/01/2012       Review of Systems  Constitutional: Negative for fever, chills, diaphoresis, activity change, appetite change, fatigue and unexpected weight change.  HENT: Negative for congestion, rhinorrhea, sinus pressure, sneezing, sore throat and trouble swallowing.   Eyes: Negative for photophobia and visual disturbance.  Respiratory: Negative for cough, chest tightness, shortness of breath, wheezing and stridor.   Cardiovascular: Negative for chest pain, palpitations and leg swelling.  Gastrointestinal: Negative for nausea, vomiting, abdominal pain, diarrhea, constipation, blood in stool, abdominal distention and anal bleeding.  Genitourinary: Negative for dysuria, hematuria, flank pain and difficulty urinating.  Musculoskeletal: Negative for myalgias, back pain, joint swelling, arthralgias and gait problem.  Skin: Negative for color change, pallor, rash and wound.  Neurological: Negative for dizziness, tremors, weakness, light-headedness and numbness.  Hematological: Negative for adenopathy. Does not bruise/bleed easily.  Psychiatric/Behavioral: Negative for behavioral problems, confusion, sleep disturbance, dysphoric mood, decreased concentration and agitation.       Objective:   Physical Exam  Constitutional: He is oriented to person, place, and time. He appears well-developed and well-nourished. No distress.  HENT:  Head: Normocephalic and atraumatic.  Mouth/Throat: Oropharynx is clear and moist. No oropharyngeal exudate.  Eyes:  Conjunctivae and EOM are normal. Pupils are equal, round, and reactive to light. No scleral icterus.  Neck: Normal range of motion. Neck supple. No JVD present.  Cardiovascular: Normal rate and regular rhythm.   Pulmonary/Chest: Effort normal and breath sounds normal. No respiratory distress. He has no wheezes.  Abdominal: He exhibits no distension and no mass. There is no tenderness. There is no rebound and no guarding.  Musculoskeletal: He exhibits no edema or tenderness.  Lymphadenopathy:    He has no cervical adenopathy.  Neurological: He is alert and oriented to person, place, and time. He exhibits normal muscle tone. Coordination normal.  Skin: Skin is warm and dry. He is not diaphoretic. No erythema. No pallor.  Psychiatric: He has a normal mood and affect. His behavior is normal. Judgment and thought content normal.          Assessment & Plan:  HIV: change to Prezcobix, Tivicay and EPIVIR. I spent greater than 25 minutes with the patient including greater than 50% of time in face to face counsel of the patient and in coordination of their care.   Hyperlipidemia: LDL at goal easily continue statin  Depression, anxiety, stress insomnia: continue Trazadone  Smoking: spent more than 3 minutes on smoking cessation counseling and will rx chatniz

## 2014-07-14 ENCOUNTER — Other Ambulatory Visit: Payer: Self-pay | Admitting: Licensed Clinical Social Worker

## 2014-07-14 DIAGNOSIS — E785 Hyperlipidemia, unspecified: Secondary | ICD-10-CM

## 2014-07-14 DIAGNOSIS — Z113 Encounter for screening for infections with a predominantly sexual mode of transmission: Secondary | ICD-10-CM

## 2014-07-14 DIAGNOSIS — E46 Unspecified protein-calorie malnutrition: Secondary | ICD-10-CM

## 2014-07-14 DIAGNOSIS — B20 Human immunodeficiency virus [HIV] disease: Secondary | ICD-10-CM

## 2014-07-14 MED ORDER — DRONABINOL 2.5 MG PO CAPS: 2.5000 mg | ORAL_CAPSULE | Freq: Two times a day (BID) | ORAL | Status: DC

## 2014-08-22 ENCOUNTER — Other Ambulatory Visit: Payer: Self-pay | Admitting: Infectious Disease

## 2014-10-04 ENCOUNTER — Other Ambulatory Visit: Payer: Self-pay | Admitting: Infectious Disease

## 2014-10-05 ENCOUNTER — Other Ambulatory Visit: Payer: Self-pay | Admitting: Infectious Disease

## 2014-11-03 ENCOUNTER — Other Ambulatory Visit (HOSPITAL_COMMUNITY)
Admission: RE | Admit: 2014-11-03 | Discharge: 2014-11-03 | Disposition: A | Discharge status: Home / Self Care | Payer: Medicare Other | Source: Ambulatory Visit | Attending: Infectious Disease | Admitting: Infectious Disease

## 2014-11-03 ENCOUNTER — Other Ambulatory Visit: Payer: Medicare Other

## 2014-11-03 DIAGNOSIS — B20 Human immunodeficiency virus [HIV] disease: Secondary | ICD-10-CM | POA: Diagnosis not present

## 2014-11-03 DIAGNOSIS — Z113 Encounter for screening for infections with a predominantly sexual mode of transmission: Secondary | ICD-10-CM | POA: Insufficient documentation

## 2014-11-03 DIAGNOSIS — F17200 Nicotine dependence, unspecified, uncomplicated: Secondary | ICD-10-CM

## 2014-11-03 DIAGNOSIS — E785 Hyperlipidemia, unspecified: Secondary | ICD-10-CM

## 2014-11-03 DIAGNOSIS — Z720 Tobacco use: Secondary | ICD-10-CM | POA: Diagnosis not present

## 2014-11-03 DIAGNOSIS — Z79899 Other long term (current) drug therapy: Secondary | ICD-10-CM

## 2014-11-03 DIAGNOSIS — N400 Enlarged prostate without lower urinary tract symptoms: Secondary | ICD-10-CM | POA: Diagnosis not present

## 2014-11-03 LAB — CBC WITH DIFFERENTIAL/PLATELET
BASOS ABS: 0 10*3/uL (ref 0.0–0.1)
Basophils Relative: 0 % (ref 0–1)
EOS ABS: 0 10*3/uL (ref 0.0–0.7)
EOS PCT: 0 % (ref 0–5)
HCT: 46.6 % (ref 39.0–52.0)
Hemoglobin: 15.9 g/dL (ref 13.0–17.0)
LYMPHS ABS: 1.7 10*3/uL (ref 0.7–4.0)
Lymphocytes Relative: 40 % (ref 12–46)
MCH: 33.3 pg (ref 26.0–34.0)
MCHC: 34.1 g/dL (ref 30.0–36.0)
MCV: 97.7 fL (ref 78.0–100.0)
MONOS PCT: 12 % (ref 3–12)
MPV: 8.3 fL — ABNORMAL LOW (ref 8.6–12.4)
Monocytes Absolute: 0.5 10*3/uL (ref 0.1–1.0)
Neutro Abs: 2 10*3/uL (ref 1.7–7.7)
Neutrophils Relative %: 48 % (ref 43–77)
Platelets: 257 10*3/uL (ref 150–400)
RBC: 4.77 MIL/uL (ref 4.22–5.81)
RDW: 15.2 % (ref 11.5–15.5)
WBC: 4.2 10*3/uL (ref 4.0–10.5)

## 2014-11-03 LAB — COMPLETE METABOLIC PANEL WITH GFR
ALK PHOS: 84 U/L (ref 40–115)
ALT: 18 U/L (ref 9–46)
AST: 19 U/L (ref 10–35)
Albumin: 4.2 g/dL (ref 3.6–5.1)
BUN: 20 mg/dL (ref 7–25)
CALCIUM: 9.4 mg/dL (ref 8.6–10.3)
CHLORIDE: 105 meq/L (ref 98–110)
CO2: 23 mEq/L (ref 20–31)
Creat: 1.02 mg/dL (ref 0.70–1.33)
GFR, Est African American: >89 mL/min (ref >=60)
GFR, Est Non African American: 80 mL/min (ref >=60)
Glucose, Bld: 94 mg/dL (ref 65–99)
Potassium: 4 mEq/L (ref 3.5–5.3)
Sodium: 141 mEq/L (ref 135–146)
TOTAL PROTEIN: 6.8 g/dL (ref 6.1–8.1)
Total Bilirubin: 0.3 mg/dL (ref 0.2–1.2)

## 2014-11-03 LAB — LIPID PANEL
Cholesterol: 167 mg/dL (ref 125–200)
HDL: 61 mg/dL (ref >=40)
LDL Cholesterol: 89 mg/dL (ref <130)
Total CHOL/HDL Ratio: 2.7 Ratio (ref <=5.0)
Triglycerides: 86 mg/dL (ref <150)
VLDL: 17 mg/dL (ref <30)

## 2014-11-04 LAB — HIV-1 RNA QUANT-NO REFLEX-BLD
HIV 1 RNA Quant: <20 copies/mL (ref <20)
HIV-1 RNA Quant, Log: <1.3 {Log} (ref <1.30)

## 2014-11-04 LAB — RPR

## 2014-11-04 LAB — URINE CYTOLOGY ANCILLARY ONLY
CHLAMYDIA, DNA PROBE: NEGATIVE
NEISSERIA GONORRHEA: NEGATIVE

## 2014-11-04 LAB — T-HELPER CELL (CD4) - (RCID CLINIC ONLY)
CD4 % Helper T Cell: 25 % — ABNORMAL LOW (ref 33–55)
CD4 T CELL ABS: 400 /uL (ref 400–2700)

## 2014-11-09 ENCOUNTER — Other Ambulatory Visit: Payer: Self-pay | Admitting: Infectious Disease

## 2014-11-09 DIAGNOSIS — E785 Hyperlipidemia, unspecified: Secondary | ICD-10-CM

## 2014-11-09 DIAGNOSIS — B20 Human immunodeficiency virus [HIV] disease: Secondary | ICD-10-CM

## 2014-11-16 ENCOUNTER — Ambulatory Visit: Payer: Medicare Other | Admitting: Infectious Disease

## 2014-12-01 ENCOUNTER — Ambulatory Visit: Payer: Medicare Other | Admitting: Infectious Disease

## 2014-12-13 ENCOUNTER — Ambulatory Visit (INDEPENDENT_AMBULATORY_CARE_PROVIDER_SITE_OTHER): Payer: Medicare Other | Admitting: Infectious Disease

## 2014-12-13 ENCOUNTER — Encounter: Payer: Self-pay | Admitting: Infectious Disease

## 2014-12-13 VITALS — BP 121/78 | HR 86 | Temp 97.3°F | Wt 145.0 lb

## 2014-12-13 DIAGNOSIS — R634 Abnormal weight loss: Secondary | ICD-10-CM | POA: Diagnosis not present

## 2014-12-13 DIAGNOSIS — E785 Hyperlipidemia, unspecified: Secondary | ICD-10-CM

## 2014-12-13 DIAGNOSIS — Z21 Asymptomatic human immunodeficiency virus [HIV] infection status: Secondary | ICD-10-CM

## 2014-12-13 DIAGNOSIS — Z23 Encounter for immunization: Secondary | ICD-10-CM | POA: Diagnosis not present

## 2014-12-13 DIAGNOSIS — Z720 Tobacco use: Secondary | ICD-10-CM | POA: Diagnosis not present

## 2014-12-13 DIAGNOSIS — I10 Essential (primary) hypertension: Secondary | ICD-10-CM | POA: Diagnosis not present

## 2014-12-13 DIAGNOSIS — B20 Human immunodeficiency virus [HIV] disease: Secondary | ICD-10-CM

## 2014-12-13 DIAGNOSIS — N400 Enlarged prostate without lower urinary tract symptoms: Secondary | ICD-10-CM | POA: Diagnosis not present

## 2014-12-13 DIAGNOSIS — F17200 Nicotine dependence, unspecified, uncomplicated: Secondary | ICD-10-CM

## 2014-12-13 HISTORY — DX: Hyperlipidemia, unspecified: E78.5

## 2014-12-13 MED ORDER — TAMSULOSIN HCL 0.4 MG PO CAPS: 0.4000 mg | ORAL_CAPSULE | Freq: Every day | ORAL | Status: DC

## 2014-12-13 MED ORDER — LAMIVUDINE 300 MG PO TABS: 300.0000 mg | ORAL_TABLET | Freq: Every day | ORAL | Status: DC

## 2014-12-13 MED ORDER — PRAVASTATIN SODIUM 40 MG PO TABS: 40.0000 mg | ORAL_TABLET | Freq: Every day | ORAL | Status: DC

## 2014-12-13 MED ORDER — DARUNAVIR-COBICISTAT 800-150 MG PO TABS: 1.0000 | ORAL_TABLET | Freq: Every day | ORAL | Status: DC

## 2014-12-13 NOTE — Progress Notes (Signed)
Subjective:    Patient ID: Dwayne Boone, male    DOB: 01-01-1956, 59 y.o.   MRN: [id number]  HPI   59 year old AA man with history of HIV, AIDs and complicated ARV history with  healthy CD4 and previously  suppressed  with prezista, norvir, epivir a TIVICAY 50 mg daily and now on slighlty more simple regimen of PREZCOBIX, Tivicay and Lamivudine.  Lab Results  Component Value Date   HIV1RNAQUANT <20 11/03/2014   Lab Results  Component Value Date   CD4TABS 400 11/03/2014   CD4TABS 460 04/26/2014   CD4TABS 390* 07/13/2013    He is still smoking and disliked the chantix. We discussed idea of e-cigarettes as a bridge to stopping altogether.  Past Medical History  Diagnosis Date  . AIDS   . 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 Weight loss   . Depression   . Neuropathy    Past Surgical History  Procedure Laterality Date  . None     Family History  Problem Relation Age of Onset  . CVA Mother   . Hypertension Mother    Social History  Substance Use Topics  . Smoking status: Current Every Day Smoker    Types: Cigarettes  . Smokeless tobacco: None     Comment: 16 daily  . Alcohol Use: No     Review of Systems  Constitutional: Negative for fever, chills, diaphoresis, activity change, fatigue and unexpected weight change.  HENT: Negative for congestion, rhinorrhea, sinus pressure, sneezing, sore throat and trouble swallowing.   Eyes: Negative for photophobia and visual disturbance.  Respiratory: Negative for cough, chest tightness, shortness of breath, wheezing and stridor.   Cardiovascular: Negative for chest pain, palpitations and leg swelling.  Gastrointestinal: Negative for nausea, vomiting, abdominal pain, diarrhea, constipation, blood in stool, abdominal distention and anal bleeding.  Genitourinary: Negative for dysuria, hematuria, flank pain and difficulty urinating.  Musculoskeletal: Negative for myalgias, back pain, joint swelling, arthralgias and  gait problem.  Skin: Negative for color change, pallor, rash and wound.  Neurological: Negative for dizziness, tremors, weakness, light-headedness and numbness.  Hematological: Negative for adenopathy. Does not bruise/bleed easily.  Psychiatric/Behavioral: Negative for behavioral problems, confusion, sleep disturbance, dysphoric mood, decreased concentration and agitation.       Objective:   Physical Exam  Constitutional: He is oriented to person, place, and time. He appears well-developed and well-nourished. No distress.  HENT:  Head: Normocephalic and atraumatic.  Mouth/Throat: Oropharynx is clear and moist. No oropharyngeal exudate.  Eyes: Conjunctivae and EOM are normal. Pupils are equal, round, and reactive to light. No scleral icterus.  Neck: Normal range of motion. Neck supple. No JVD present.  Cardiovascular: Normal rate and regular rhythm.   Pulmonary/Chest: Effort normal and breath sounds normal. No respiratory distress. He has no wheezes.  Abdominal: He exhibits no distension and no mass. There is no tenderness. There is no rebound and no guarding.  Musculoskeletal: He exhibits no edema.  Lymphadenopathy:    He has no cervical adenopathy.  Neurological: He is alert and oriented to person, place, and time. He exhibits normal muscle tone. Coordination normal.  Skin: Skin is warm and dry. He is not diaphoretic. No erythema. No pallor.  Psychiatric: He has a normal mood and affect. His behavior is normal. Judgment and thought content normal.  Nursing note and vitals reviewed.         Assessment & Plan:  HIV: continue  Prezcobix, Tivicay and EPIVIR.   Lab Results  Component Value Date   HIV1RNAQUANT <20 11/03/2014      Hyperlipidemia: LDL at goal easily continue statin.  Lipid Panel     Component Value Date/Time   CHOL 167 11/03/2014 1108   TRIG 86 11/03/2014 1108   HDL 61 11/03/2014 1108   CHOLHDL 2.7 11/03/2014 1108   VLDL 17 11/03/2014 1108   LDLCALC 89  11/03/2014 1108     Depression, anxiety, stress insomnia: continue Trazadone stable  Smoking: try electronic cigarettes

## 2014-12-13 NOTE — Progress Notes (Signed)
Chief complaint: 6 month followup for HIV on medications.

## 2014-12-20 ENCOUNTER — Other Ambulatory Visit: Payer: Self-pay | Admitting: Infectious Disease

## 2014-12-20 DIAGNOSIS — B20 Human immunodeficiency virus [HIV] disease: Secondary | ICD-10-CM

## 2014-12-22 ENCOUNTER — Other Ambulatory Visit: Payer: Self-pay | Admitting: Infectious Disease

## 2015-01-05 NOTE — Progress Notes (Signed)
[facility] notified via fax. Landis Gandy, RN

## 2015-03-29 ENCOUNTER — Ambulatory Visit: Payer: Medicare Other

## 2015-04-12 ENCOUNTER — Ambulatory Visit: Payer: Medicare Other

## 2015-04-26 ENCOUNTER — Other Ambulatory Visit: Payer: Medicare Other

## 2015-04-26 DIAGNOSIS — N400 Enlarged prostate without lower urinary tract symptoms: Secondary | ICD-10-CM | POA: Diagnosis not present

## 2015-04-26 DIAGNOSIS — B20 Human immunodeficiency virus [HIV] disease: Secondary | ICD-10-CM | POA: Diagnosis not present

## 2015-04-26 DIAGNOSIS — E785 Hyperlipidemia, unspecified: Secondary | ICD-10-CM | POA: Diagnosis not present

## 2015-04-26 LAB — COMPLETE METABOLIC PANEL WITH GFR
ALBUMIN: 3.9 g/dL (ref 3.6–5.1)
ALK PHOS: 90 U/L (ref 40–115)
ALT: 15 U/L (ref 9–46)
AST: 16 U/L (ref 10–35)
BILIRUBIN TOTAL: 0.3 mg/dL (ref 0.2–1.2)
BUN: 14 mg/dL (ref 7–25)
CALCIUM: 9.4 mg/dL (ref 8.6–10.3)
CO2: 27 mmol/L (ref 20–31)
Chloride: 103 mmol/L (ref 98–110)
Creat: 1.1 mg/dL (ref 0.70–1.33)
GFR, EST AFRICAN AMERICAN: 84 mL/min (ref >=60)
GFR, Est Non African American: 73 mL/min (ref >=60)
Glucose, Bld: 93 mg/dL (ref 65–99)
Potassium: 4.4 mmol/L (ref 3.5–5.3)
Sodium: 139 mmol/L (ref 135–146)
TOTAL PROTEIN: 6.4 g/dL (ref 6.1–8.1)

## 2015-04-26 LAB — CBC WITH DIFFERENTIAL/PLATELET
Basophils Absolute: 0 10*3/uL (ref 0.0–0.1)
Basophils Relative: 0 % (ref 0–1)
Eosinophils Absolute: 0 10*3/uL (ref 0.0–0.7)
Eosinophils Relative: 1 % (ref 0–5)
HCT: 45.5 % (ref 39.0–52.0)
HEMOGLOBIN: 15.1 g/dL (ref 13.0–17.0)
LYMPHS ABS: 1.9 10*3/uL (ref 0.7–4.0)
Lymphocytes Relative: 40 % (ref 12–46)
MCH: 32.9 pg (ref 26.0–34.0)
MCHC: 33.2 g/dL (ref 30.0–36.0)
MCV: 99.1 fL (ref 78.0–100.0)
MONO ABS: 0.7 10*3/uL (ref 0.1–1.0)
MONOS PCT: 14 % — AB (ref 3–12)
MPV: 8.2 fL — ABNORMAL LOW (ref 8.6–12.4)
NEUTROS ABS: 2.1 10*3/uL (ref 1.7–7.7)
NEUTROS PCT: 45 % (ref 43–77)
Platelets: 260 10*3/uL (ref 150–400)
RBC: 4.59 MIL/uL (ref 4.22–5.81)
RDW: 14.7 % (ref 11.5–15.5)
WBC: 4.7 10*3/uL (ref 4.0–10.5)

## 2015-04-26 LAB — LIPID PANEL
CHOLESTEROL: 191 mg/dL (ref 125–200)
HDL: 49 mg/dL (ref >=40)
LDL Cholesterol: 108 mg/dL (ref <130)
TRIGLYCERIDES: 170 mg/dL — AB (ref <150)
Total CHOL/HDL Ratio: 3.9 Ratio (ref <=5.0)
VLDL: 34 mg/dL — ABNORMAL HIGH (ref <30)

## 2015-04-27 LAB — RPR

## 2015-04-27 LAB — T-HELPER CELL (CD4) - (RCID CLINIC ONLY)
CD4 % Helper T Cell: 29 % — ABNORMAL LOW (ref 33–55)
CD4 T Cell Abs: 530 /uL (ref 400–2700)

## 2015-04-28 LAB — HIV-1 RNA QUANT-NO REFLEX-BLD

## 2015-05-10 ENCOUNTER — Ambulatory Visit (INDEPENDENT_AMBULATORY_CARE_PROVIDER_SITE_OTHER): Payer: Medicare Other | Admitting: Infectious Disease

## 2015-05-10 ENCOUNTER — Telehealth: Payer: Self-pay | Admitting: *Deleted

## 2015-05-10 ENCOUNTER — Encounter: Payer: Self-pay | Admitting: Infectious Disease

## 2015-05-10 VITALS — BP 124/81 | HR 92 | Temp 97.6°F | Ht 71.0 in | Wt 149.0 lb

## 2015-05-10 DIAGNOSIS — E785 Hyperlipidemia, unspecified: Secondary | ICD-10-CM

## 2015-05-10 DIAGNOSIS — F32A Depression, unspecified: Secondary | ICD-10-CM

## 2015-05-10 DIAGNOSIS — R339 Retention of urine, unspecified: Secondary | ICD-10-CM

## 2015-05-10 DIAGNOSIS — I10 Essential (primary) hypertension: Secondary | ICD-10-CM | POA: Diagnosis not present

## 2015-05-10 DIAGNOSIS — F17200 Nicotine dependence, unspecified, uncomplicated: Secondary | ICD-10-CM | POA: Diagnosis not present

## 2015-05-10 DIAGNOSIS — Z21 Asymptomatic human immunodeficiency virus [HIV] infection status: Secondary | ICD-10-CM | POA: Diagnosis not present

## 2015-05-10 DIAGNOSIS — F329 Major depressive disorder, single episode, unspecified: Secondary | ICD-10-CM

## 2015-05-10 DIAGNOSIS — B20 Human immunodeficiency virus [HIV] disease: Secondary | ICD-10-CM | POA: Diagnosis not present

## 2015-05-10 MED ORDER — DOLUTEGRAVIR SODIUM 50 MG PO TABS: 50.0000 mg | ORAL_TABLET | Freq: Every day | ORAL | Status: DC

## 2015-05-10 MED ORDER — LAMIVUDINE 300 MG PO TABS: 300.0000 mg | ORAL_TABLET | Freq: Every day | ORAL | Status: DC

## 2015-05-10 MED ORDER — DARUNAVIR-COBICISTAT 800-150 MG PO TABS: 1.0000 | ORAL_TABLET | Freq: Every day | ORAL | Status: DC

## 2015-05-10 MED ORDER — TAMSULOSIN HCL 0.4 MG PO CAPS: 0.4000 mg | ORAL_CAPSULE | Freq: Every day | ORAL | Status: AC

## 2015-05-10 MED ORDER — PRAVASTATIN SODIUM 40 MG PO TABS: 40.0000 mg | ORAL_TABLET | Freq: Every day | ORAL | Status: DC

## 2015-05-10 NOTE — Progress Notes (Signed)
chief complaint: continued problems with urinary retention  Subjective:    Patient ID: Dwayne Boone, male    DOB: 10/10/1955, 60 y.o.   MRN: [id number]  HPI   60 year old AA man with history of HIV, AIDs and complicated ARV history with  healthy CD4 and previously  suppressed  with prezista, norvir, epivir a TIVICAY 50 mg daily and now on slighlty more simple regimen of PREZCOBIX, Tivicay and Lamivudine.  Lab Results  Component Value Date   HIV1RNAQUANT <20 04/26/2015   Lab Results  Component Value Date   CD4TABS 530 04/26/2015   CD4TABS 400 11/03/2014   CD4TABS 460 04/26/2014     He has been having trouble with urinary retention and required catheterization in the ER many months ago. I started him on Flomax but he still is suffering from his symptoms and is asking for referral to urology which we will make  Past Medical History  Diagnosis Date  . AIDS ([place])   . 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 Weight loss   . Depression   . Neuropathy ([place])   . Hyperlipidemia 12/13/2014   Past Surgical History  Procedure Laterality Date  . None     Family History  Problem Relation Age of Onset  . CVA Mother   . Hypertension Mother    Social History  Substance Use Topics  . Smoking status: Current Every Day Smoker    Types: Cigarettes  . Smokeless tobacco: None     Comment: 8 daily  . Alcohol Use: No     Review of Systems  Constitutional: Negative for fever, chills, diaphoresis, activity change, fatigue and unexpected weight change.  HENT: Negative for congestion, rhinorrhea, sinus pressure, sneezing, sore throat and trouble swallowing.   Eyes: Negative for photophobia and visual disturbance.  Respiratory: Negative for cough, chest tightness, shortness of breath, wheezing and stridor.   Cardiovascular: Negative for chest pain, palpitations and leg swelling.  Gastrointestinal: Negative for nausea, vomiting, abdominal pain, diarrhea, constipation, blood in  stool, abdominal distention and anal bleeding.  Genitourinary: Positive for decreased urine volume and difficulty urinating. Negative for dysuria, hematuria and flank pain.  Musculoskeletal: Negative for myalgias, back pain, joint swelling, arthralgias and gait problem.  Skin: Negative for color change, pallor, rash and wound.  Neurological: Negative for dizziness, tremors, weakness, light-headedness and numbness.  Hematological: Negative for adenopathy. Does not bruise/bleed easily.  Psychiatric/Behavioral: Negative for behavioral problems, confusion, sleep disturbance, dysphoric mood, decreased concentration and agitation.       Objective:   Physical Exam  Constitutional: He is oriented to person, place, and time. He appears well-developed and well-nourished. No distress.  HENT:  Head: Normocephalic and atraumatic.  Mouth/Throat: Oropharynx is clear and moist. No oropharyngeal exudate.  Eyes: Conjunctivae and EOM are normal. Pupils are equal, round, and reactive to light. No scleral icterus.  Neck: Normal range of motion. Neck supple. No JVD present.  Cardiovascular: Normal rate and regular rhythm.   Pulmonary/Chest: Effort normal and breath sounds normal. No respiratory distress. He has no wheezes.  Abdominal: He exhibits no distension and no mass. There is no tenderness. There is no rebound and no guarding.  Musculoskeletal: He exhibits no edema.  Lymphadenopathy:    He has no cervical adenopathy.  Neurological: He is alert and oriented to person, place, and time. He exhibits normal muscle tone. Coordination normal.  Skin: Skin is warm and dry. He is not diaphoretic. No erythema. No pallor.  Psychiatric: He  has a normal mood and affect. His behavior is normal. Judgment and thought content normal.  Nursing note and vitals reviewed.         Assessment & Plan:  HIV: continue  Prezcobix, Tivicay and EPIVIR and RTC in 6 months  Lab Results  Component Value Date   HIV1RNAQUANT  <20 04/26/2015      Hyperlipidemia: LDL at goal easily continue statin.  Lipid Panel     Component Value Date/Time   CHOL 191 04/26/2015 0922   TRIG 170* 04/26/2015 0922   HDL 49 04/26/2015 0922   CHOLHDL 3.9 04/26/2015 0922   VLDL 34* 04/26/2015 0922   LDLCALC 108 04/26/2015 0922     Depression, anxiety, stress insomnia: continue Trazadone stable  Smoking: try electronic cigarettes  Urinary retention:  Chief Flomax and referred to urology.  I spent greater than 25 minutes with the patient including greater than 50% of time in face to face counsel of the patient  Regarding his HIV AIDS depression smoking and urinary tension and in coordination of his care.

## 2015-05-10 NOTE — Telephone Encounter (Signed)
Patient referred to [facility], notified of appointment 3-13, 1:30.  He was given the address and phone number.  Patient accepted appointment.  Will fax notes to [phone]. Landis Gandy, RN

## 2015-05-12 NOTE — Telephone Encounter (Signed)
Faxed via [facility]

## 2015-06-19 DIAGNOSIS — N400 Enlarged prostate without lower urinary tract symptoms: Secondary | ICD-10-CM | POA: Diagnosis not present

## 2015-06-19 DIAGNOSIS — R350 Frequency of micturition: Secondary | ICD-10-CM | POA: Diagnosis not present

## 2015-06-19 DIAGNOSIS — Z0000 Encounter for general adult medical examination without abnormal findings: Secondary | ICD-10-CM | POA: Diagnosis not present

## 2015-09-12 DIAGNOSIS — N401 Enlarged prostate with lower urinary tract symptoms: Secondary | ICD-10-CM | POA: Diagnosis not present

## 2015-09-12 DIAGNOSIS — R330 Drug induced retention of urine: Secondary | ICD-10-CM | POA: Diagnosis not present

## 2015-09-15 DIAGNOSIS — N401 Enlarged prostate with lower urinary tract symptoms: Secondary | ICD-10-CM | POA: Diagnosis not present

## 2015-09-15 DIAGNOSIS — R338 Other retention of urine: Secondary | ICD-10-CM | POA: Diagnosis not present

## 2015-10-18 ENCOUNTER — Telehealth: Payer: Self-pay | Admitting: Infectious Disease

## 2015-10-18 NOTE — Telephone Encounter (Signed)
Was notififed that patients roommate was concerned that Dwayne Boone not doing  Well. He was with undetectable viral load but he has not been seen since January and may not have renewed ADAP can we bring him back for labs, visit with Dwayne Boone or Dwayne Boone and appt with me aftterwards (latter will need to be in August but labs ADAP etc can be asap)

## 2015-10-18 NOTE — Telephone Encounter (Signed)
Appointments scheduled for ADAP, lab and MD.  Patient declined appointment with counselor at this time.

## 2015-10-18 NOTE — Telephone Encounter (Signed)
Thanks so much Denise 

## 2015-10-19 ENCOUNTER — Ambulatory Visit: Payer: Medicare Other

## 2015-10-19 ENCOUNTER — Other Ambulatory Visit (HOSPITAL_COMMUNITY)
Admission: RE | Admit: 2015-10-19 | Discharge: 2015-10-19 | Disposition: A | Discharge status: Home / Self Care | Payer: Medicare Other | Source: Ambulatory Visit | Attending: Infectious Disease | Admitting: Infectious Disease

## 2015-10-19 ENCOUNTER — Other Ambulatory Visit: Payer: Medicare Other

## 2015-10-19 DIAGNOSIS — E785 Hyperlipidemia, unspecified: Secondary | ICD-10-CM | POA: Diagnosis not present

## 2015-10-19 DIAGNOSIS — B20 Human immunodeficiency virus [HIV] disease: Secondary | ICD-10-CM

## 2015-10-19 DIAGNOSIS — D126 Benign neoplasm of colon, unspecified: Secondary | ICD-10-CM | POA: Insufficient documentation

## 2015-10-19 DIAGNOSIS — R339 Retention of urine, unspecified: Secondary | ICD-10-CM | POA: Diagnosis not present

## 2015-10-19 DIAGNOSIS — N400 Enlarged prostate without lower urinary tract symptoms: Secondary | ICD-10-CM | POA: Diagnosis not present

## 2015-10-19 LAB — CBC WITH DIFFERENTIAL/PLATELET
BASOS PCT: 0 %
Basophils Absolute: 0 cells/uL (ref 0–200)
Eosinophils Absolute: 52 cells/uL (ref 15–500)
Eosinophils Relative: 1 %
HEMATOCRIT: 42.5 % (ref 38.5–50.0)
Hemoglobin: 14.4 g/dL (ref 13.2–17.1)
LYMPHS ABS: 2080 {cells}/uL (ref 850–3900)
LYMPHS PCT: 40 %
MCH: 32.5 pg (ref 27.0–33.0)
MCHC: 33.9 g/dL (ref 32.0–36.0)
MCV: 95.9 fL (ref 80.0–100.0)
MONO ABS: 624 {cells}/uL (ref 200–950)
MPV: 8.5 fL (ref 7.5–12.5)
Monocytes Relative: 12 %
NEUTROS ABS: 2444 {cells}/uL (ref 1500–7800)
Neutrophils Relative %: 47 %
Platelets: 267 10*3/uL (ref 140–400)
RBC: 4.43 MIL/uL (ref 4.20–5.80)
RDW: 15.4 % — AB (ref 11.0–15.0)
WBC: 5.2 10*3/uL (ref 3.8–10.8)

## 2015-10-20 LAB — T-HELPER CELL (CD4) - (RCID CLINIC ONLY)
CD4 T CELL HELPER: 29 % — AB (ref 33–55)
CD4 T Cell Abs: 680 /uL (ref 400–2700)

## 2015-10-20 LAB — COMPLETE METABOLIC PANEL WITH GFR
ALT: 15 U/L (ref 9–46)
AST: 15 U/L (ref 10–35)
Albumin: 4 g/dL (ref 3.6–5.1)
Alkaline Phosphatase: 99 U/L (ref 40–115)
BUN: 14 mg/dL (ref 7–25)
CALCIUM: 8.9 mg/dL (ref 8.6–10.3)
CHLORIDE: 108 mmol/L (ref 98–110)
CO2: 25 mmol/L (ref 20–31)
CREATININE: 1.21 mg/dL (ref 0.70–1.25)
GFR, Est African American: 75 mL/min (ref >=60)
GFR, Est Non African American: 65 mL/min (ref >=60)
Glucose, Bld: 79 mg/dL (ref 65–99)
Potassium: 3.9 mmol/L (ref 3.5–5.3)
Sodium: 143 mmol/L (ref 135–146)
Total Bilirubin: 0.3 mg/dL (ref 0.2–1.2)
Total Protein: 6.3 g/dL (ref 6.1–8.1)

## 2015-10-20 LAB — LIPID PANEL
CHOL/HDL RATIO: 3.4 ratio (ref <=5.0)
Cholesterol: 208 mg/dL — ABNORMAL HIGH (ref 125–200)
HDL: 62 mg/dL (ref >=40)
LDL CALC: 116 mg/dL (ref <130)
Triglycerides: 149 mg/dL (ref <150)
VLDL: 30 mg/dL (ref <30)

## 2015-10-20 LAB — URINE CYTOLOGY ANCILLARY ONLY
Chlamydia: NEGATIVE
NEISSERIA GONORRHEA: NEGATIVE

## 2015-10-20 LAB — PSA: PSA: 441.9 ng/mL — ABNORMAL HIGH (ref <=4.00)

## 2015-10-20 LAB — RPR

## 2015-10-20 NOTE — Progress Notes (Signed)
[place] very good thanks Firefighter!

## 2015-10-20 NOTE — Progress Notes (Signed)
Ok very good and we should have notes from his Urologist

## 2015-10-23 LAB — HIV-1 RNA QUANT-NO REFLEX-BLD
HIV 1 RNA Quant: <20 copies/mL (ref <20)
HIV-1 RNA Quant, Log: <1.3 Log copies/mL (ref <1.30)

## 2015-11-13 ENCOUNTER — Encounter: Payer: Self-pay | Admitting: Infectious Disease

## 2015-11-13 DIAGNOSIS — R8279 Other abnormal findings on microbiological examination of urine: Secondary | ICD-10-CM | POA: Diagnosis not present

## 2015-11-13 DIAGNOSIS — R972 Elevated prostate specific antigen [PSA]: Secondary | ICD-10-CM | POA: Diagnosis not present

## 2015-11-13 DIAGNOSIS — N401 Enlarged prostate with lower urinary tract symptoms: Secondary | ICD-10-CM | POA: Diagnosis not present

## 2015-11-13 DIAGNOSIS — R350 Frequency of micturition: Secondary | ICD-10-CM | POA: Diagnosis not present

## 2015-12-01 DIAGNOSIS — C61 Malignant neoplasm of prostate: Secondary | ICD-10-CM | POA: Diagnosis not present

## 2015-12-01 DIAGNOSIS — R972 Elevated prostate specific antigen [PSA]: Secondary | ICD-10-CM | POA: Diagnosis not present

## 2015-12-04 ENCOUNTER — Ambulatory Visit (INDEPENDENT_AMBULATORY_CARE_PROVIDER_SITE_OTHER): Payer: Medicare Other | Admitting: Infectious Disease

## 2015-12-04 ENCOUNTER — Encounter: Payer: Self-pay | Admitting: Infectious Disease

## 2015-12-04 VITALS — BP 122/75 | HR 84 | Temp 98.1°F | Ht 71.0 in | Wt 144.0 lb

## 2015-12-04 DIAGNOSIS — F329 Major depressive disorder, single episode, unspecified: Secondary | ICD-10-CM

## 2015-12-04 DIAGNOSIS — R972 Elevated prostate specific antigen [PSA]: Secondary | ICD-10-CM | POA: Diagnosis not present

## 2015-12-04 DIAGNOSIS — F32A Depression, unspecified: Secondary | ICD-10-CM

## 2015-12-04 DIAGNOSIS — B20 Human immunodeficiency virus [HIV] disease: Secondary | ICD-10-CM | POA: Diagnosis not present

## 2015-12-04 HISTORY — DX: Elevated prostate specific antigen (PSA): R97.20

## 2015-12-04 NOTE — Progress Notes (Signed)
Chief complaint: he is awaiting back results from his prostate biopsy  Subjective:    Patient ID: Dwayne Boone, male    DOB: 02-Nov-1955, 60 y.o.   MRN: [id number]  HPI  60 year old AA man with history of HIV, AIDs and complicated ARV history with  healthy CD4 and previously  suppressed  withsimple regimen of PREZCOBIX, Tivicay and Lamivudine.  His treatment hx was as follows based on prior note:  He was diagnosed in early 1990s and initially on regimen of AZT/3TC, then entered ACTG 372 in 1997 which consisted of [id number] (ABacavir) 300mg  twice daily, DMP 266 (Efavirenz) 600 mg twice daily, Adefovir 120mg  daily, L carinitine and Nelfinavir 750 twice daily vs placebo twice daily. While on this regimen he initially suppressed his virus but then failed with VL nearly 170K in Spring of 1998. Dr. Orene Desanctis changed him to Didanosine 200mg  twice daily, Stavudine (zerit) 40mg  twice daily, norvir 400mg  twice daily, and saquinavir 400mg  twice daily. He was then changed in 01/1997 to Epivir 150mg  twice dialy with continuation of the zerit, norvir and saquinavir, in 2001 he had kaletra swapped in for the saquinavir and high dose norvir. He had done quite well with this  regimen with excellent viroogical suppression and immune reconstitution. I changed him in 2010 to Prezista, Norvir and Zerit, then Prezista, Norvir and Isentress with nice suppression and then Prezista, Norvir and Tivicay, Epivir and then Prezcobix, Tivicay and Epivir.  Lab Results  Component Value Date   HIV1RNAQUANT <20 10/19/2015   HIV1RNAQUANT <20 04/26/2015   HIV1RNAQUANT <20 11/03/2014      Lab Results  Component Value Date   CD4TABS 680 10/19/2015   CD4TABS 530 04/26/2015   CD4TABS 400 11/03/2014     Past Medical History:  Diagnosis Date  . AIDS ([place])   . Depression   . Hyperlipidemia 12/13/2014  . 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 Neuropathy ([place])   . Weight loss    Past Surgical History:  Procedure  Laterality Date  . none     Family History  Problem Relation Age of Onset  . CVA Mother   . Hypertension Mother    Social History  Substance Use Topics  . Smoking status: Current Every Day Smoker    Types: Cigarettes  . Smokeless tobacco: Not on file     Comment: 8 daily  . Alcohol use No     Review of Systems  Constitutional: Negative for activity change, chills, diaphoresis, fatigue, fever and unexpected weight change.  HENT: Negative for congestion, rhinorrhea, sinus pressure, sneezing, sore throat and trouble swallowing.   Eyes: Negative for photophobia and visual disturbance.  Respiratory: Negative for cough, chest tightness, shortness of breath, wheezing and stridor.   Cardiovascular: Negative for chest pain, palpitations and leg swelling.  Gastrointestinal: Negative for abdominal distention, abdominal pain, anal bleeding, blood in stool, constipation, diarrhea, nausea and vomiting.  Genitourinary: Positive for decreased urine volume and difficulty urinating. Negative for dysuria, flank pain and hematuria.  Musculoskeletal: Negative for arthralgias, back pain, gait problem, joint swelling and myalgias.  Skin: Negative for color change, pallor, rash and wound.  Neurological: Negative for dizziness, tremors, weakness, light-headedness and numbness.  Hematological: Negative for adenopathy. Does not bruise/bleed easily.  Psychiatric/Behavioral: Negative for agitation, behavioral problems, confusion, decreased concentration, dysphoric mood and sleep disturbance.       Objective:   Physical Exam  Constitutional: He is oriented to person, place, and time. He appears well-developed and well-nourished.  No distress.  HENT:  Head: Normocephalic and atraumatic.  Mouth/Throat: Oropharynx is clear and moist. No oropharyngeal exudate.  Eyes: Conjunctivae and EOM are normal. Pupils are equal, round, and reactive to light. No scleral icterus.  Neck: Normal range of motion. Neck supple.  No JVD present.  Cardiovascular: Normal rate and regular rhythm.   Pulmonary/Chest: Effort normal and breath sounds normal. No respiratory distress. He has no wheezes.  Abdominal: He exhibits no distension and no mass. There is no tenderness. There is no rebound and no guarding.  Musculoskeletal: He exhibits no edema.  Lymphadenopathy:    He has no cervical adenopathy.  Neurological: He is alert and oriented to person, place, and time. He exhibits normal muscle tone. Coordination normal.  Skin: Skin is warm and dry. He is not diaphoretic. No erythema. No pallor.  Psychiatric: He has a normal mood and affect. His behavior is normal. Judgment and thought content normal.  Nursing note and vitals reviewed.         Assessment & Plan:  HIV: continue  Prezcobix, Tivicay and EPIVIR for now and get a Genosure Archive test in case we need to craft his regimen to fit with therapy for prostate cancer and to simplify period   Depression, anxiety, stress insomnia: continue Trazadone stable  Likely prostate cancer: awaiting prostate biopsy results and plan from Urology, Oncology  I spent greater than 25 minutes with the patient including greater than 50% of time in face to face counsel of the patient  Regarding his HIV AIDS depression smoking and urinary tension and in coordination of his care.

## 2015-12-06 ENCOUNTER — Encounter (HOSPITAL_COMMUNITY): Payer: Self-pay | Admitting: *Deleted

## 2015-12-06 ENCOUNTER — Emergency Department (HOSPITAL_COMMUNITY)
Admission: EM | Admit: 2015-12-06 | Discharge: 2015-12-06 | Disposition: A | Discharge status: Home / Self Care | Payer: Medicare Other | Attending: Emergency Medicine | Admitting: Emergency Medicine

## 2015-12-06 DIAGNOSIS — R339 Retention of urine, unspecified: Secondary | ICD-10-CM | POA: Diagnosis not present

## 2015-12-06 DIAGNOSIS — Z79899 Other long term (current) drug therapy: Secondary | ICD-10-CM | POA: Insufficient documentation

## 2015-12-06 DIAGNOSIS — F17210 Nicotine dependence, cigarettes, uncomplicated: Secondary | ICD-10-CM | POA: Insufficient documentation

## 2015-12-06 DIAGNOSIS — Z21 Asymptomatic human immunodeficiency virus [HIV] infection status: Secondary | ICD-10-CM | POA: Insufficient documentation

## 2015-12-06 DIAGNOSIS — I10 Essential (primary) hypertension: Secondary | ICD-10-CM | POA: Insufficient documentation

## 2015-12-06 LAB — URINE MICROSCOPIC-ADD ON

## 2015-12-06 LAB — URINALYSIS, ROUTINE W REFLEX MICROSCOPIC
Bilirubin Urine: NEGATIVE
Glucose, UA: 100 mg/dL — AB
KETONES UR: NEGATIVE mg/dL
LEUKOCYTES UA: NEGATIVE
NITRITE: NEGATIVE
PROTEIN: NEGATIVE mg/dL
Specific Gravity, Urine: 1.025 (ref 1.005–1.030)
pH: 6.5 (ref 5.0–8.0)

## 2015-12-06 NOTE — ED Triage Notes (Signed)
Pt c/o bladder retention. Pt denies any pelvic or abdominal pain. Pt states the last time he urinated was this morning. Pt reports dysuria with urination. Pt had biopsy on prostate on Friday. Pt needed to have bladder drained by catheter.

## 2015-12-06 NOTE — ED Provider Notes (Signed)
[facility] DEPT Provider Note   CSN: [id number] Arrival date & time: 12/06/15  0212     History   Chief Complaint Chief Complaint  Patient presents with  . Urinary Retention    HPI ADD Dwayne Boone is a 60 y.o. male.  HPI  Patient has PMH of AIDS, depression, hyperlipidemia, PSA elevation and weight loss.  The patient reports having an [place] and biopsy done of his prostate a few days ago and today he started to retain urine.This happened to him approx 6 months ago as well. He is able to pass some urine but it is painful and he still feels like he needs to relieve himself. He has not had any fevers, weakness, lethargy, confusion. He goes to [facility].  Past Medical History:  Diagnosis Date  . AIDS ([place])   . Depression   . Hyperlipidemia 12/13/2014  . 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 Neuropathy ([place])   . PSA elevation 12/04/2015  . Weight loss     Patient Active Problem List   Diagnosis Date Noted  . PSA elevation 12/04/2015  . Hyperlipidemia 12/13/2014  . AIDS ([place]) 05/18/2014  . 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 Weight loss   . Depression   . Neuropathy ([place])   . HTN (hypertension) 12/19/2011  . ARM PAIN, RIGHT 06/19/2009  . DENTAL CARIES 06/14/2008  . WEIGHT LOSS 11/12/2007  . HYPERLIPIDEMIA 07/08/2006  . TOBACCO ABUSE 07/08/2006  . ANEMIA-NOS 04/15/2006  . Human immunodeficiency virus (HIV) disease ([place]) 02/17/2006  . SYPHILIS, EARLY, SYMPTOMATIC, SECONDARY, SKIN 02/17/2006  . XEROSIS, SKIN 02/17/2006    Past Surgical History:  Procedure Laterality Date  . none         Home Medications    Prior to Admission medications   Medication Sig Start Date End Date Taking? Authorizing Provider  darunavir-cobicistat (PREZCOBIX) 800-150 MG tablet Take 1 tablet by mouth daily. 05/10/15  Yes Truman Hayward, MD  dolutegravir (TIVICAY) 50 MG tablet Take 1 tablet (50 mg total) by mouth daily. 05/10/15  Yes Truman Hayward, MD  finasteride (PROSCAR) 5 MG tablet Take 5 mg by mouth daily. 12/01/15  Yes Historical Provider, MD  lamivudine (EPIVIR) 300 MG tablet Take 1 tablet (300 mg total) by mouth daily. 05/10/15  Yes Truman Hayward, MD  pravastatin (PRAVACHOL) 40 MG tablet Take 1 tablet (40 mg total) by mouth daily. 05/10/15  Yes Truman Hayward, MD  tamsulosin [facility]) 0.4 MG CAPS capsule Take 1 capsule (0.4 mg total) by mouth daily after breakfast. 05/10/15  Yes Truman Hayward, MD    Family History Family History  Problem Relation Age of Onset  . CVA Mother   . Hypertension Mother     Social History Social History  Substance Use Topics  . Smoking status: Current Every Day Smoker    Types: Cigarettes  . Smokeless tobacco: Not on file     Comment: 8 daily  . Alcohol use No     Allergies   Review of patient's allergies indicates no known allergies.   Review of Systems Review of Systems  Review of Systems All other systems negative except as documented in the HPI. All pertinent positives and negatives as reviewed in the HPI.  Physical Exam Updated Vital Signs BP 115/82   Pulse 71   Temp 97.8 F (36.6 C) (Oral)   Resp 16   SpO2 95%   Physical Exam  Constitutional:  He appears well-developed and well-nourished. He appears distressed (uncomfortable due to pain).  HENT:  Head: Normocephalic and atraumatic.  Eyes: Pupils are equal, round, and reactive to light.  Neck: Normal range of motion. Neck supple.  Cardiovascular: Normal rate and regular rhythm.   Pulmonary/Chest: Effort normal.  Abdominal: Soft.  Genitourinary:  Genitourinary Comments: Bladder scan shows 500 mL of urine retained.  Neurological: He is alert.  Skin: Skin is warm and dry.  Nursing note and vitals reviewed.    ED Treatments / Results  Labs (all labs ordered are listed, but only abnormal results are displayed) Labs Reviewed  URINALYSIS, ROUTINE W REFLEX MICROSCOPIC (NOT AT [facility]) - Abnormal;  Notable for the following:       Result Value   Color, Urine GREEN (*)    Glucose, UA 100 (*)    Hgb urine dipstick TRACE (*)    All other components within normal limits  URINE MICROSCOPIC-ADD ON - Abnormal; Notable for the following:    Squamous Epithelial / LPF 0-5 (*)    Bacteria, UA RARE (*)    All other components within normal limits  URINE CULTURE    EKG  EKG Interpretation None       Radiology No results found.  Procedures Procedures (including critical care time)  Medications Ordered in ED Medications - No data to display   Initial Impression / Assessment and Plan / ED Course  I have reviewed the triage vital signs and the nursing notes.  Pertinent labs & imaging results that were available during my care of the patient were reviewed by me and considered in my medical decision making (see chart for details).  Clinical Course    Discussed case with Dr. Betsey Holiday, he recommends inserting foley catheter as opposed to in-and-out and referring back to his Urologist.  Urinalysis is negative. Dr. Betsey Holiday has discharged patient. No abx necessary at this time.  Final Clinical Impressions(s) / ED Diagnoses   Final diagnoses:  Urinary retention    New Prescriptions New Prescriptions   No medications on file     Delos Haring, PA-C 12/06/15 0403    Orpah Greek, MD 12/06/15 [phone]

## 2015-12-07 LAB — URINE CULTURE: CULTURE: NO GROWTH

## 2015-12-22 ENCOUNTER — Other Ambulatory Visit: Payer: Self-pay | Admitting: Urology

## 2015-12-22 DIAGNOSIS — R972 Elevated prostate specific antigen [PSA]: Secondary | ICD-10-CM | POA: Diagnosis not present

## 2015-12-22 DIAGNOSIS — C61 Malignant neoplasm of prostate: Secondary | ICD-10-CM

## 2015-12-26 ENCOUNTER — Telehealth: Payer: Self-pay | Admitting: Infectious Disease

## 2015-12-26 NOTE — Telephone Encounter (Signed)
PATIENTS GENOSURE ARCHIVE CAME BACK AS FOLLOWING:       No DRAM so Prezcobix fully active. No INSTI mutations so Tivicay fully active. He does have R to lamivudine which he is taking.  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   I would consider replacing his Lamivudine with Descovy to give him a 3rd active agent. Alternatively we could go to a "nuke" sparing regimen of Prezcobix, Tivicay and Rilpivirine (latter 2 drugs are coming as coformulated regimen soon vs Prezcobix, Tivicay and Intelence if PPIs are an issue   I am not comfortable moving away froma  PI based regimen quite yet though perhaps we could get away with Tivicay and Odefsey with nearly 3 active drugs and no booster to interact with other drugs

## 2015-12-27 NOTE — Telephone Encounter (Signed)
Sounds good to me

## 2015-12-27 NOTE — Telephone Encounter (Signed)
Bring him in to change his 3TC to [facility] tomorrow.

## 2015-12-28 ENCOUNTER — Ambulatory Visit (INDEPENDENT_AMBULATORY_CARE_PROVIDER_SITE_OTHER): Payer: Medicare Other | Admitting: Pharmacist Clinician (PhC)/ Clinical Pharmacy Specialist

## 2015-12-28 DIAGNOSIS — B20 Human immunodeficiency virus [HIV] disease: Secondary | ICD-10-CM

## 2015-12-28 MED ORDER — EMTRICITABINE-TENOFOVIR AF 200-25 MG PO TABS: 1.0000 | ORAL_TABLET | Freq: Every day | ORAL | 11 refills | Status: DC

## 2015-12-28 NOTE — Progress Notes (Signed)
Patient ID: Dwayne Boone, male   DOB: [birth year], 60 y.o.   MRN: [id number] HPI: Dwayne Boone is a 60 y.o. male who is here to discuss a change in his ART.   Allergies: No Known Allergies  Vitals:    Past Medical History: Past Medical History:  Diagnosis Date  . AIDS ([place])   . Depression   . Hyperlipidemia 12/13/2014  . 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 Neuropathy ([place])   . PSA elevation 12/04/2015  . Weight loss     Social History: Social History   Social History  . Marital status: Single    Spouse name: N/A  . Number of children: N/A  . Years of education: N/A   Social History Main Topics  . Smoking status: Current Every Day Smoker    Types: Cigarettes  . Smokeless tobacco: Not on file     Comment: 8 daily  . Alcohol use No  . Drug use: No  . Sexual activity: Not Currently    Partners: Male   Other Topics Concern  . Not on file   Social History Narrative  . No narrative on file    Previous Regimen:   Current Regimen: Prezcobix/DTG/3TC  Labs: HIV 1 RNA Quant (copies/mL)  Date Value  10/19/2015 <20  04/26/2015 <20  11/03/2014 <20   CD4 T Cell Abs (/uL)  Date Value  10/19/2015 680  04/26/2015 530  11/03/2014 400   Hep B S Ab (no units)  Date Value  05/21/2007 NEG   HCV Ab (no units)  Date Value  04/05/2009 NEG    CrCl: CrCl cannot be calculated (Unknown ideal weight.).  Lipids:    Component Value Date/Time   CHOL 208 (H) 10/19/2015 1513   TRIG 149 10/19/2015 1513   HDL 62 10/19/2015 1513   CHOLHDL 3.4 10/19/2015 1513   VLDL 30 10/19/2015 1513   LDLCALC 116 10/19/2015 1513    Assessment: Dwayne Boone is here for a change in his ART after the result came back from his archive genotype. See chart review>>media. 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 D/w with Dr. Tommy Boone about changing out his 3TC to Descovy so that we would have activity for TAF. He has ADAP so rx has been sent to [facility] in  [place]. He is going to call them after he gets home today. Counseled on side effects and to take all 3 with food. He has an appt with Dr. Tommy Boone at the end of Oct. I'll schedule him for labs the week prior to that so it'll be back by the time of his office visit.  Recommendations:  Cont DTG/Prez Stop 3TC Start Descovy HIV labs in Oct F/u Dr. Tommy Boone at the end of Oct  Pham, Dwayne Boone, PharmD, BCPS, AAHIVP, [employer] [facility] 12/28/2015, 9:16 AM

## 2015-12-28 NOTE — Patient Instructions (Signed)
Cont Tivicay and Prezcobix  Stop lamivudine Start Descovy 1 tablet daily  Take all 3 tablets with food

## 2016-01-01 ENCOUNTER — Encounter (HOSPITAL_COMMUNITY)
Admission: RE | Admit: 2016-01-01 | Discharge: 2016-01-01 | Disposition: A | Discharge status: Home / Self Care | Payer: Medicare Other | Source: Ambulatory Visit | Attending: Urology | Admitting: Urology

## 2016-01-01 DIAGNOSIS — C61 Malignant neoplasm of prostate: Secondary | ICD-10-CM | POA: Diagnosis not present

## 2016-01-01 MED ORDER — TECHNETIUM TC 99M MEDRONATE IV KIT
21.0000 | PACK | Freq: Once | INTRAVENOUS | Status: AC | PRN
  Administered 2016-01-01: 21 via INTRAVENOUS

## 2016-01-03 DIAGNOSIS — C61 Malignant neoplasm of prostate: Secondary | ICD-10-CM | POA: Diagnosis not present

## 2016-01-03 DIAGNOSIS — C7951 Secondary malignant neoplasm of bone: Secondary | ICD-10-CM | POA: Diagnosis not present

## 2016-01-03 DIAGNOSIS — C775 Secondary and unspecified malignant neoplasm of intrapelvic lymph nodes: Secondary | ICD-10-CM | POA: Diagnosis not present

## 2016-01-09 ENCOUNTER — Encounter: Payer: Self-pay | Admitting: Oncology

## 2016-01-09 ENCOUNTER — Telehealth: Payer: Self-pay | Admitting: Oncology

## 2016-01-09 NOTE — Telephone Encounter (Signed)
Pt confirmed appt, completed intake, mailed pt letter, called referring provider with appt date/time.

## 2016-01-15 ENCOUNTER — Telehealth: Payer: Self-pay | Admitting: *Deleted

## 2016-01-15 ENCOUNTER — Ambulatory Visit (HOSPITAL_BASED_OUTPATIENT_CLINIC_OR_DEPARTMENT_OTHER): Payer: Medicare Other | Admitting: Oncology

## 2016-01-15 ENCOUNTER — Encounter: Payer: Self-pay | Admitting: Medical Oncology

## 2016-01-15 ENCOUNTER — Telehealth: Payer: Self-pay | Admitting: Pharmacist Clinician (PhC)/ Clinical Pharmacy Specialist

## 2016-01-15 ENCOUNTER — Telehealth: Payer: Self-pay | Admitting: Oncology

## 2016-01-15 DIAGNOSIS — C61 Malignant neoplasm of prostate: Secondary | ICD-10-CM

## 2016-01-15 DIAGNOSIS — B20 Human immunodeficiency virus [HIV] disease: Secondary | ICD-10-CM | POA: Diagnosis not present

## 2016-01-15 DIAGNOSIS — C7951 Secondary malignant neoplasm of bone: Secondary | ICD-10-CM

## 2016-01-15 MED ORDER — LIDOCAINE-PRILOCAINE 2.5-2.5 % EX CREA: 1.0000 "application " | TOPICAL_CREAM | CUTANEOUS | 0 refills | Status: AC | PRN

## 2016-01-15 MED ORDER — PROCHLORPERAZINE MALEATE 10 MG PO TABS: 10.0000 mg | ORAL_TABLET | Freq: Four times a day (QID) | ORAL | 0 refills | Status: AC | PRN

## 2016-01-15 NOTE — Progress Notes (Signed)
Not a big fan of fuzeon sub cutaneous I supposed we could go with IV fuzeon BID. I wonder how long the taxotere is going to be for? When I read uptodate lexicomp they recommended havling the dose of the taxotere when using with COBI. 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 I need to look at his genotypes and last note again more thoroughly

## 2016-01-15 NOTE — Telephone Encounter (Signed)
Per the LOS I have scheduled appts and notified the scheduler 

## 2016-01-15 NOTE — Telephone Encounter (Signed)
Message sent to chemo scheduler to be added. Chemo edu class scheduled. All labs, MD appointments scheduled. Avs report and appointment schedule given to patient, per 01/15/16 los.

## 2016-01-15 NOTE — Progress Notes (Signed)
Reason for Referral: Prostate cancer   HPI: 60 year old gentleman currently of [place] where he lived the majority of his life. He is a gentleman with history of HIV, hyperlipidemia and neuropathy. He started developing urinary symptoms including difficulty urination, nocturia and frequency. He was evaluated by urology and his PSA was noted to be elevated at 441. He underwent a prostate biopsy on 12/01/2015. The biopsy showed prostate adenocarcinoma with high-volume disease in all 12 cores. His Gleason score was 4+5 = 9 and the majority of the cores with few cores that had 4+4 = 8. His staging workup including a CT scan of the abdomen and pelvis which showed metastatic lymphadenopathy and his bone scan on 01/01/2016 showed numerous metastatic bone lesions involving the axial skeleton including the spine, ribs, pelvis among others. He was started on Flomax and his urinary symptoms improved. He was also started on androgen deprivation under the care of Dr. Pilar Jarvis. He was referred to me for evaluation for further therapy. Clinically, he reports his urinary symptoms has improved and his urination stream has also gone stronger. He denied any hematuria or dysuria. He does have periodic hip pain which also have improved. He denied any other bone pain at this time. He denied any back pain, shoulder pain. His appetite has been down some of lost close to 6 pounds in the last few pounds. Despite that, he continues to enjoy excellent quality of life and performs activities of daily living without any decline. He is able to drive without any major issues.  He denied any headaches, blurry vision, syncope or seizures. He does not report any fevers, chills or sweats. He does not report any chest pain, palpitation, orthopnea or leg edema. He does not report any cough, wheezing or hemoptysis. He does not report any nausea, vomiting, abdominal pain but does report occasional constipation. He does not report any hematuria or  dysuria. He does not report any skeletal complaints of arthralgias or myalgias. Remaining review of systems unremarkable.    Past Medical History:  Diagnosis Date  . AIDS ([place])   . Depression   . Hyperlipidemia 12/13/2014  . 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 Neuropathy ([place])   . PSA elevation 12/04/2015  . Weight loss   :  Past Surgical History:  Procedure Laterality Date  . none    :   Current Outpatient Prescriptions:  .  darunavir-cobicistat (PREZCOBIX) 800-150 MG tablet, Take 1 tablet by mouth daily., Disp: 30 tablet, Rfl: 11 .  dolutegravir (TIVICAY) 50 MG tablet, Take 1 tablet (50 mg total) by mouth daily., Disp: 30 tablet, Rfl: 11 .  emtricitabine-tenofovir AF (DESCOVY) 200-25 MG tablet, Take 1 tablet by mouth daily., Disp: 30 tablet, Rfl: 11 .  finasteride (PROSCAR) 5 MG tablet, Take 5 mg by mouth daily., Disp: , Rfl: 11 .  lidocaine-prilocaine (EMLA) cream, Apply 1 application topically as needed., Disp: 30 g, Rfl: 0 .  pravastatin (PRAVACHOL) 40 MG tablet, Take 1 tablet (40 mg total) by mouth daily., Disp: 30 tablet, Rfl: 11 .  prochlorperazine (COMPAZINE) 10 MG tablet, Take 1 tablet (10 mg total) by mouth every 6 (six) hours as needed for nausea or vomiting., Disp: 30 tablet, Rfl: 0 .  tamsulosin (FLOMAX) 0.4 MG CAPS capsule, Take 1 capsule (0.4 mg total) by mouth daily after breakfast., Disp: 30 capsule, Rfl: 11:  No Known Allergies:  Family History  Problem Relation Age of Onset  . CVA Mother   . Hypertension Mother   :  Social History   Social History  . Marital status: Single    Spouse name: N/A  . Number of children: N/A  . Years of education: N/A   Occupational History  . Not on file.   Social History Main Topics  . Smoking status: Current Every Day Smoker    Types: Cigarettes  . Smokeless tobacco: Not on file     Comment: 8 daily  . Alcohol use No  . Drug use: No  . Sexual activity: Not Currently    Partners: Male   Other  Topics Concern  . Not on file   Social History Narrative  . No narrative on file  :  Pertinent items are noted in HPI.  Exam: Blood pressure 105/71, pulse (!) 103, temperature 98.2 F (36.8 C), temperature source Oral, resp. rate 18, height 5\' 11"  (1.803 m), weight 134 lb 14.4 oz (61.2 kg), SpO2 100 %. General appearance: alert and cooperative Head: Normocephalic, without obvious abnormality Throat: lips, mucosa, and tongue normal; teeth and gums normal Neck: no adenopathy Back: negative Resp: clear to auscultation bilaterally Chest wall: no tenderness Cardio: regular rate and rhythm, S1, S2 normal, no murmur, click, rub or gallop GI: soft, non-tender; bowel sounds normal; no masses,  no organomegaly Extremities: extremities normal, atraumatic, no cyanosis or edema Pulses: 2+ and symmetric Skin: Skin color, texture, turgor normal. No rashes or lesions  CBC    Component Value Date/Time   WBC 5.2 10/19/2015 1513   RBC 4.43 10/19/2015 1513   HGB 14.4 10/19/2015 1513   HCT 42.5 10/19/2015 1513   PLT 267 10/19/2015 1513   MCV 95.9 10/19/2015 1513   MCH 32.5 10/19/2015 1513   MCHC 33.9 10/19/2015 1513   RDW 15.4 (H) 10/19/2015 1513   LYMPHSABS 2,080 10/19/2015 1513   MONOABS 624 10/19/2015 1513   EOSABS 52 10/19/2015 1513   BASOSABS 0 10/19/2015 1513      Chemistry      Component Value Date/Time   NA 143 10/19/2015 1513   K 3.9 10/19/2015 1513   CL 108 10/19/2015 1513   CO2 25 10/19/2015 1513   BUN 14 10/19/2015 1513   CREATININE 1.21 10/19/2015 1513      Component Value Date/Time   CALCIUM 8.9 10/19/2015 1513   ALKPHOS 99 10/19/2015 1513   AST 15 10/19/2015 1513   ALT 15 10/19/2015 1513   BILITOT 0.3 10/19/2015 1513         Nm Bone Scan Whole Body  Result Date: 01/01/2016 CLINICAL DATA:  Prostate cancer. EXAM: NUCLEAR MEDICINE WHOLE BODY BONE SCAN TECHNIQUE: Whole body anterior and posterior images were obtained approximately 3 hours after intravenous  injection of radiopharmaceutical. RADIOPHARMACEUTICALS:  21.0 MCi Technetium-22m MDP IV COMPARISON:  CT scan same day. FINDINGS: There are numerous areas of increased uptake most notably in the spine, ribs and pelvis consistent with metastatic prostate cancer. No hip lesions or extremity lesions. IMPRESSION: Numerous metastatic bone lesions involving the axial skeleton. Electronically Signed   By: Marijo Sanes M.D.   On: 01/01/2016 16:51    Assessment and Plan:    60 year old gentleman with the following issues:  1. Prostate cancer diagnosed in August 2017. He presented with a PSA of 451 and a Gleason score 4+5 = 9. He has high volume disease with all 12 cores of his prostate involved with high-grade cancer. His staging workup showed metastatic disease including retroperitoneal adenopathy as well as diffuse bony metastasis. He has been started on androgen deprivation with Lupron.  The natural course of this disease was discussed today in detail. He understands that his disease is incurable although it can be treatable and palliated for a period of time. Androgen deprivation therapy is distended of care but additional therapy is warranted in his case. He is reasonably young and in reasonably good shape and certainly can handle additional therapy. These options would include Taxotere chemotherapy versus Zytiga.  The rationale for using Taxotere chemotherapy was discussed. A number of clinical trials have proven the benefits of Taxotere chemotherapy in the hormone sensitive prostate cancer. In general, 6 cycles of Taxotere chemotherapy given on every three-week basis have proven to improve overall survival of median close to 13 months. This is in the setting of hormone sensitive disease with high-volume metastasis which certainly applies to him. Risks and benefits of Taxotere chemotherapy was discussed extensively. Complications include nausea, vomiting, myelosuppression, neutropenia, neutropenic sepsis  among others. Worsening neuropathy as well as infusion related complications were also noted. The benefit would be longer disease control and substantial overall survival benefit.  Alternatively, Fabio Asa has been tested in this particular setting and have also shown to have improvement in and progression free survival. The benefits of Zytiga is an oral drug was less side effects. The complications associated with Fabio Asa would be potential interactions with his HIV medication as well as the cost associated with this indefinitely therapy.  After discussing the risks and benefits of both those options we have elected to proceed with systemic chemotherapy because of the advantage of referred to radiation therapy and potentially less interaction with his other oral medication.  He is agreeable to proceed with systemic chemotherapy after chemotherapy education class which will be scheduled in the near future.  2. IV access: Risks and benefits of Port-A-Cath insertion was discussed. These complications include bleeding, thrombosis and infection. I favor placing a Port-A-Cath as anticipate future need for systemic chemotherapy and is agreeable to have that done.  3. Androgen depravation: I have recommended continuing Lupron indefinitely.  4. Bone directed therapy: I agree with Dr. Pilar Jarvis that he would be a excellent candidate for Xgeva after obtaining dental clearance.  5. Neutropenia prophylaxis: He'll receive Neulasta after each chemotherapy cycle.  6. Antiemetics: Prescription for Compazine was made available to the patient for rescue nausea.  7. HIV: He is currently managed by Dr. Tommy Medal and we will have to make sure that chemotherapy does not interfere with his current oral regimen.   8. Follow-up: Will be in the next 2 weeks to start the first cycle 6 chemotherapy cycles.

## 2016-01-15 NOTE — Progress Notes (Signed)
START ON PATHWAY REGIMEN - Prostate  POS77: Docetaxel 75 mg/m2 q21 Days Without Prednisone x 6 Cycles with Medical Castration  Docetaxel 75 mg/m2:   A cycle is every 21 days:     Docetaxel (Taxotere(R)) 75 mg/m2 in 250 mL NS IV over one hour Dose Mod: None Additional Orders: Premedicate with dexamethasone 8 mg PO BID for three days beginning 1 day prior to therapy  **Always confirm dose/schedule in your pharmacy [facility] system[facility] + Bical[facility]amide:   A cycle is every 12 weeks:     Leuprolide acetate (Lupron(R)) 22.5 mg flat dose intramuscularly every 12 weeks Dose Mod: None   Daily:     Bicalutamide (Casodex(R)) 50 mg orally once a day Dose Mod: None  **Always confirm dose/schedule in your pharmacy ordering system**    Patient Characteristics: Adenocarcinoma, Metastatic, Hormonal Therapy Candidate, High Volume Disease* AJCC T Stage: X AJCC Stage Grouping: IV Current radiographic evidence of distant metastasis? Yes PSA: X Gleason Primary: X Gleason Secondary: X Gleason Score: X AJCC M Stage: X AJCC N Stage: X Would you be surprised if this patient died  in the next year? I would be surprised if this patient died in the next year  Intent of Therapy: Non-Curative / Palliative Intent, Discussed with Patient

## 2016-01-15 NOTE — Telephone Encounter (Signed)
He has [place] so rx was tx to [facility] in [place]. They will handle any copay issue if there is one.

## 2016-01-16 NOTE — Progress Notes (Signed)
Lets bring him in and change his regimen

## 2016-01-18 ENCOUNTER — Ambulatory Visit (INDEPENDENT_AMBULATORY_CARE_PROVIDER_SITE_OTHER): Payer: Medicare Other | Admitting: Pharmacist Clinician (PhC)/ Clinical Pharmacy Specialist

## 2016-01-18 ENCOUNTER — Encounter: Payer: Self-pay | Admitting: Infectious Disease

## 2016-01-18 DIAGNOSIS — B20 Human immunodeficiency virus [HIV] disease: Secondary | ICD-10-CM

## 2016-01-18 MED ORDER — EMTRICITAB-RILPIVIR-TENOFOV AF 200-25-25 MG PO TABS: 1.0000 | ORAL_TABLET | Freq: Every day | ORAL | 11 refills | Status: AC

## 2016-01-18 NOTE — Progress Notes (Signed)
Patient ID: Dwayne Boone, male   DOB: 06-Mar-1956, 60 y.o.   MRN: [id number] HPI: Dwayne Boone is a 60 y.o. male who is here to be counseled on his new ART so he start his chemo.   Allergies: No Known Allergies  Vitals:    Past Medical History: Past Medical History:  Diagnosis Date  . AIDS ([place])   . Depression   . Hyperlipidemia 12/13/2014  . 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 Neuropathy ([place])   . PSA elevation 12/04/2015  . Weight loss     Social History: Social History   Social History  . Marital status: Single    Spouse name: N/A  . Number of children: N/A  . Years of education: N/A   Social History Main Topics  . Smoking status: Current Every Day Smoker    Types: Cigarettes  . Smokeless tobacco: Not on file     Comment: 8 daily  . Alcohol use No  . Drug use: No  . Sexual activity: Not Currently    Partners: Male   Other Topics Concern  . Not on file   Social History Narrative  . No narrative on file    Previous Regimen: AZT/3TC, ABC, EFV, Adefovir, NFV, DDI, D4T, RTV, SQV  Current Regimen: Prezcobix/Descovy/DTG  Labs: HIV 1 RNA Quant (copies/mL)  Date Value  10/19/2015 <20  04/26/2015 <20  11/03/2014 <20   CD4 T Cell Abs (/uL)  Date Value  10/19/2015 680  04/26/2015 530  11/03/2014 400   Hep B S Ab (no units)  Date Value  05/21/2007 NEG   HCV Ab (no units)  Date Value  04/05/2009 NEG    CrCl: CrCl cannot be calculated (Patient's most recent lab result is older than the maximum 21 days allowed.).  Lipids:    Component Value Date/Time   CHOL 208 (H) 10/19/2015 1513   TRIG 149 10/19/2015 1513   HDL 62 10/19/2015 1513   CHOLHDL 3.4 10/19/2015 1513   VLDL 30 10/19/2015 1513   LDLCALC 116 10/19/2015 1513    Assessment: We recently changed his ART to Prez/Desc/DTG recently due to the full resistance to 3TC. He is doing on the regimen. However, he is going to be undergoing chemo with Taxotere for his prostate CA  soon. Dr. Alen Blew asked [place] to review the interaction between his chemo and ART. The main issue was the cobicistat could boost the level of Taxotere. He has had an extensive hx of ART (listed above). A recent archive genotype was done that showed resistance to NRTI except for low level to TDF. NNRTI resistance to EFV but still sens to RPV and ETR. He also has resistance to multiple PIs except for DRV. After reviewing the case with Dr. Tommy Medal, we are going to use Odefsey/DTG to avoid the interaction so Dr. Alen Blew can use full dose Taxotere. There is a potential for the interaction with his dexamethosone but it's only a premed with his chemo cycles so it should not be an issue.  Recommendations:  Stop Descovy/Prezcobix Cont [address] [place] 1 PO daily with a meal  [name], PharmD, BCPS, AAHIVP, CPP Clinical Infectious [facility] 01/18/2016, 9:20 AM

## 2016-01-23 ENCOUNTER — Encounter: Payer: Self-pay | Admitting: *Deleted

## 2016-01-23 ENCOUNTER — Other Ambulatory Visit: Payer: Medicare Other

## 2016-01-24 ENCOUNTER — Other Ambulatory Visit: Payer: Self-pay | Admitting: Radiology

## 2016-01-24 ENCOUNTER — Other Ambulatory Visit (HOSPITAL_COMMUNITY): Payer: Medicare Other

## 2016-01-25 ENCOUNTER — Ambulatory Visit (HOSPITAL_COMMUNITY)
Admission: RE | Admit: 2016-01-25 | Discharge: 2016-01-25 | Disposition: A | Discharge status: Home / Self Care | Payer: Medicare Other | Source: Ambulatory Visit | Attending: Oncology | Admitting: Oncology

## 2016-01-25 ENCOUNTER — Encounter (HOSPITAL_COMMUNITY): Payer: Self-pay

## 2016-01-25 ENCOUNTER — Other Ambulatory Visit: Payer: Self-pay | Admitting: Oncology

## 2016-01-25 DIAGNOSIS — B20 Human immunodeficiency virus [HIV] disease: Secondary | ICD-10-CM | POA: Insufficient documentation

## 2016-01-25 DIAGNOSIS — Z8249 Family history of ischemic heart disease and other diseases of the circulatory system: Secondary | ICD-10-CM | POA: Diagnosis not present

## 2016-01-25 DIAGNOSIS — E785 Hyperlipidemia, unspecified: Secondary | ICD-10-CM | POA: Insufficient documentation

## 2016-01-25 DIAGNOSIS — Z823 Family history of stroke: Secondary | ICD-10-CM | POA: Diagnosis not present

## 2016-01-25 DIAGNOSIS — C61 Malignant neoplasm of prostate: Secondary | ICD-10-CM | POA: Diagnosis not present

## 2016-01-25 DIAGNOSIS — C7951 Secondary malignant neoplasm of bone: Secondary | ICD-10-CM | POA: Insufficient documentation

## 2016-01-25 DIAGNOSIS — G629 Polyneuropathy, unspecified: Secondary | ICD-10-CM | POA: Diagnosis not present

## 2016-01-25 DIAGNOSIS — F329 Major depressive disorder, single episode, unspecified: Secondary | ICD-10-CM | POA: Insufficient documentation

## 2016-01-25 DIAGNOSIS — F17210 Nicotine dependence, cigarettes, uncomplicated: Secondary | ICD-10-CM | POA: Insufficient documentation

## 2016-01-25 DIAGNOSIS — Z5111 Encounter for antineoplastic chemotherapy: Secondary | ICD-10-CM | POA: Diagnosis not present

## 2016-01-25 HISTORY — PX: IR GENERIC HISTORICAL: IMG1180011

## 2016-01-25 LAB — CBC WITH DIFFERENTIAL/PLATELET
BASOS ABS: 0 10*3/uL (ref 0.0–0.1)
Basophils Relative: 0 %
Eosinophils Absolute: 0.1 10*3/uL (ref 0.0–0.7)
Eosinophils Relative: 1 %
HEMATOCRIT: 43.1 % (ref 39.0–52.0)
Hemoglobin: 14.8 g/dL (ref 13.0–17.0)
LYMPHS PCT: 41 %
Lymphs Abs: 1.6 10*3/uL (ref 0.7–4.0)
MCH: 33.2 pg (ref 26.0–34.0)
MCHC: 34.3 g/dL (ref 30.0–36.0)
MCV: 96.6 fL (ref 78.0–100.0)
MONO ABS: 0.5 10*3/uL (ref 0.1–1.0)
Monocytes Relative: 14 %
NEUTROS ABS: 1.7 10*3/uL (ref 1.7–7.7)
Neutrophils Relative %: 44 %
Platelets: 292 10*3/uL (ref 150–400)
RBC: 4.46 MIL/uL (ref 4.22–5.81)
RDW: 14 % (ref 11.5–15.5)
WBC: 3.9 10*3/uL — ABNORMAL LOW (ref 4.0–10.5)

## 2016-01-25 LAB — BASIC METABOLIC PANEL
ANION GAP: 7 (ref 5–15)
BUN: 23 mg/dL — ABNORMAL HIGH (ref 6–20)
CO2: 25 mmol/L (ref 22–32)
Calcium: 9.6 mg/dL (ref 8.9–10.3)
Chloride: 107 mmol/L (ref 101–111)
Creatinine, Ser: 0.98 mg/dL (ref 0.61–1.24)
GFR calc Af Amer: >60 mL/min (ref >60)
GLUCOSE: 104 mg/dL — AB (ref 65–99)
POTASSIUM: 4.2 mmol/L (ref 3.5–5.1)
Sodium: 139 mmol/L (ref 135–145)

## 2016-01-25 LAB — PROTIME-INR
INR: 0.94
Prothrombin Time: 12.6 seconds (ref 11.4–15.2)

## 2016-01-25 MED ORDER — CEFAZOLIN SODIUM-DEXTROSE 2-4 GM/100ML-% IV SOLN
2.0000 g | INTRAVENOUS | Status: DC
  Filled 2016-01-25: qty 100

## 2016-01-25 MED ORDER — LIDOCAINE-EPINEPHRINE (PF) 2 %-1:200000 IJ SOLN
INTRAMUSCULAR | Status: AC
  Filled 2016-01-25: qty 20

## 2016-01-25 MED ORDER — LIDOCAINE HCL 1 % IJ SOLN
INTRAMUSCULAR | Status: AC
  Filled 2016-01-25: qty 20

## 2016-01-25 MED ORDER — HEPARIN SOD (PORK) LOCK FLUSH 100 UNIT/ML IV SOLN
INTRAVENOUS | Status: AC
  Filled 2016-01-25: qty 5

## 2016-01-25 MED ORDER — FENTANYL CITRATE (PF) 100 MCG/2ML IJ SOLN
INTRAMUSCULAR | Status: AC
  Filled 2016-01-25: qty 4

## 2016-01-25 MED ORDER — SODIUM CHLORIDE 0.9 % IV SOLN
INTRAVENOUS | Status: DC
  Administered 2016-01-25: 08:00:00 via INTRAVENOUS

## 2016-01-25 MED ORDER — HEPARIN SOD (PORK) LOCK FLUSH 100 UNIT/ML IV SOLN
INTRAVENOUS | Status: AC | PRN
  Administered 2016-01-25: 500 [IU] via INTRAVENOUS

## 2016-01-25 MED ORDER — LIDOCAINE-EPINEPHRINE (PF) 2 %-1:200000 IJ SOLN
INTRAMUSCULAR | Status: AC | PRN
  Administered 2016-01-25: 10 mL via INTRADERMAL

## 2016-01-25 MED ORDER — MIDAZOLAM HCL 2 MG/2ML IJ SOLN
INTRAMUSCULAR | Status: AC
  Filled 2016-01-25: qty 6

## 2016-01-25 MED ORDER — FENTANYL CITRATE (PF) 100 MCG/2ML IJ SOLN
INTRAMUSCULAR | Status: AC | PRN
  Administered 2016-01-25: 50 ug via INTRAVENOUS

## 2016-01-25 MED ORDER — MIDAZOLAM HCL 2 MG/2ML IJ SOLN
INTRAMUSCULAR | Status: AC | PRN
  Administered 2016-01-25: 0.5 mg via INTRAVENOUS
  Administered 2016-01-25 (×2): 1 mg via INTRAVENOUS

## 2016-01-25 MED ORDER — LIDOCAINE HCL 1 % IJ SOLN
INTRAMUSCULAR | Status: AC | PRN
  Administered 2016-01-25: 10 mL via INTRADERMAL

## 2016-01-25 NOTE — Procedures (Signed)
S/p RT IJ POWER PORT  Tip svcra No comp Stable Ready for use Full report in PACS  

## 2016-01-25 NOTE — Discharge Instructions (Signed)
Implanted Port Insertion, Care After °Refer to this sheet in the next few weeks. These instructions provide you with information on caring for yourself after your procedure. Your health care provider may also give you more specific instructions. Your treatment has been planned according to current medical practices, but problems sometimes occur. Call your health care provider if you have any problems or questions after your procedure. °WHAT TO EXPECT AFTER THE PROCEDURE °After your procedure, it is typical to have the following:  °· Discomfort at the port insertion site. Ice packs to the area will help. °· Bruising on the skin over the port. This will subside in 3-4 days. °HOME CARE INSTRUCTIONS °· After your port is placed, you will get a manufacturer's information card. The card has information about your port. Keep this card with you at all times.   °· Know what kind of port you have. There are many types of ports available.   °· Wear a medical alert bracelet in case of an emergency. This can help alert health care workers that you have a port.   °· The port can stay in for as long as your health care provider believes it is necessary.   °· A home health care nurse may give medicines and take care of the port.   °· You or a family member can get special training and directions for giving medicine and taking care of the port at home.   °SEEK MEDICAL CARE IF:  °· Your port does not flush or you are unable to get a blood return.   °· You have a fever or chills. °SEEK IMMEDIATE MEDICAL CARE IF: °· You have new fluid or pus coming from your incision.   °· You notice a bad smell coming from your incision site.   °· You have swelling, pain, or more redness at the incision or port site.   °· You have chest pain or shortness of breath. °  °This information is not intended to replace advice given to you by your health care provider. Make sure you discuss any questions you have with your health care provider. °  °Document  Released: 01/13/2013 Document Revised: 03/30/2013 Document Reviewed: 01/13/2013 °Elsevier Interactive Patient Education ©2016 [employer] °Moderate Conscious Sedation, Adult, Care After °Refer to this sheet in the next few weeks. These instructions provide you with information on caring for yourself after your procedure. Your health care provider may also give you more specific instructions. Your treatment has been planned according to current medical practices, but problems sometimes occur. Call your health care provider if you have any problems or questions after your procedure. °WHAT TO EXPECT AFTER THE PROCEDURE  °After your procedure: °· You may feel sleepy, clumsy, and have poor balance for several hours. °· Vomiting may occur if you eat too soon after the procedure. °HOME CARE INSTRUCTIONS °· Do not participate in any activities where you could become injured for at least 24 hours. Do not: °¨ Drive. °¨ Swim. °¨ Ride a bicycle. °¨ Operate heavy machinery. °¨ Cook. °¨ Use power tools. °¨ Climb ladders. °¨ Work from a high place. °· Do not make important decisions or sign legal documents until you are improved. °· If you vomit, drink water, juice, or soup when you can drink without vomiting. Make sure you have little or no nausea before eating solid foods. °· Only take over-the-counter or prescription medicines for pain, discomfort, or fever as directed by your health care provider. °· Make sure you and your family fully understand everything about the medicines given   to you, including what side effects may occur. °· You should not drink alcohol, take sleeping pills, or take medicines that cause drowsiness for at least 24 hours. °· If you smoke, do not smoke without supervision. °· If you are feeling better, you may resume normal activities 24 hours after you were sedated. °· Keep all appointments with your health care provider. °SEEK MEDICAL CARE IF: °· Your skin is pale or bluish in color. °· You continue to  feel nauseous or vomit. °· Your pain is getting worse and is not helped by medicine. °· You have bleeding or swelling. °· You are still sleepy or feeling clumsy after 24 hours. °SEEK IMMEDIATE MEDICAL CARE IF: °· You develop a rash. °· You have difficulty breathing. °· You develop any type of allergic problem. °· You have a fever. °MAKE SURE YOU: °· Understand these instructions. °· Will watch your condition. °· Will get help right away if you are not doing well or get worse. °  °This information is not intended to replace advice given to you by your health care provider. Make sure you discuss any questions you have with your health care provider. °  °Document Released: 01/13/2013 Document Revised: 04/15/2014 Document Reviewed: 01/13/2013 °Elsevier Interactive Patient Education ©2016 Elsevier Inc. ° °

## 2016-01-25 NOTE — Consult Note (Signed)
Chief Complaint: Patient was seen in consultation today for port a cath placement  Referring Physician(s): Wyatt Portela  Supervising Physician: Daryll Brod  Patient Status: [facility] - Out-pt  History of Present Illness: Dwayne Boone is a 60 y.o. male  with history of metastatic prostate cancer and HIV who presents today for Port-A-Cath placement for chemotherapy.  Past Medical History:  Diagnosis Date  . AIDS ([place])   . Depression   . Hyperlipidemia 12/13/2014  . 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 Neuropathy ([place])   . PSA elevation 12/04/2015  . Weight loss     Past Surgical History:  Procedure Laterality Date  . none      Allergies: Review of patient's allergies indicates no known allergies.  Medications: Prior to Admission medications   Medication Sig Start Date End Date Taking? Authorizing Provider  dolutegravir (TIVICAY) 50 MG tablet Take 1 tablet (50 mg total) by mouth daily. 05/10/15  Yes Truman Hayward, MD  emtricitabine-rilpivir-tenofovir AF (ODEFSEY) 200-25-25 MG TABS tablet Take 1 tablet by mouth daily with breakfast. 01/18/16  Yes Truman Hayward, MD  finasteride (PROSCAR) 5 MG tablet Take 5 mg by mouth daily. 12/01/15  Yes Historical Provider, MD  tamsulosin (FLOMAX) 0.4 MG CAPS capsule Take 1 capsule (0.4 mg total) by mouth daily after breakfast. 05/10/15  Yes Truman Hayward, MD  lidocaine-prilocaine (EMLA) cream Apply 1 application topically as needed. 01/15/16   Wyatt Portela, MD  pravastatin (PRAVACHOL) 40 MG tablet Take 1 tablet (40 mg total) by mouth daily. 05/10/15   Truman Hayward, MD  prochlorperazine (COMPAZINE) 10 MG tablet Take 1 tablet (10 mg total) by mouth every 6 (six) hours as needed for nausea or vomiting. 01/15/16   Wyatt Portela, MD     Family History  Problem Relation Age of Onset  . CVA Mother   . Hypertension Mother     Social History   Social History  . Marital status: Single    Spouse name: N/A   . Number of children: N/A  . Years of education: N/A   Social History Main Topics  . Smoking status: Current Every Day Smoker    Packs/day: 0.50    Types: Cigarettes  . Smokeless tobacco: Never Used     Comment: 8 daily  . Alcohol use No  . Drug use: No  . Sexual activity: Not Currently    Partners: Male   Other Topics Concern  . None   Social History Narrative  . None      Review of Systems denies fever, headache, chest pain, dyspnea, cough, abdominal pain, back pain, nausea, vomiting or abnormal bleeding. He does have hip pain and weight loss.  Vital Signs: BP (!) 127/92 (BP Location: Left Arm)   Pulse 99   Temp 97.5 F (36.4 C) (Oral)   Resp 16   Ht 5\' 11"  (1.803 m)   Wt 134 lb (60.8 kg)   SpO2 98%   BMI 18.69 kg/m   Physical Exam awake, alert. Chest with slightly diminished breath sounds right base, left clear.  Heart with regular rate and rhythm. Abdomen soft, positive bowel sounds, nontender. Lower extremities with no edema.  Mallampati Score:     Imaging: Nm Bone Scan Whole Body  Result Date: 01/01/2016 CLINICAL DATA:  Prostate cancer. EXAM: NUCLEAR MEDICINE WHOLE BODY BONE SCAN TECHNIQUE: Whole body anterior and posterior images were obtained approximately 3 hours after intravenous injection of  radiopharmaceutical. RADIOPHARMACEUTICALS:  21.0 MCi Technetium-68m MDP IV COMPARISON:  CT scan same day. FINDINGS: There are numerous areas of increased uptake most notably in the spine, ribs and pelvis consistent with metastatic prostate cancer. No hip lesions or extremity lesions. IMPRESSION: Numerous metastatic bone lesions involving the axial skeleton. Electronically Signed   By: Marijo Sanes M.D.   On: 01/01/2016 16:51    Labs:  CBC:  Recent Labs  04/26/15 0922 10/19/15 1513 01/25/16 0810  WBC 4.7 5.2 3.9*  HGB 15.1 14.4 14.8  HCT 45.5 42.5 43.1  PLT 260 267 292    COAGS:  Recent Labs  01/25/16 0810  INR 0.94    BMP:  Recent Labs   04/26/15 0922 10/19/15 1513 01/25/16 0810  NA 139 143 139  K 4.4 3.9 4.2  CL 103 108 107  CO2 27 25 25   GLUCOSE 93 79 104*  BUN 14 14 23*  CALCIUM 9.4 8.9 9.6  CREATININE 1.10 1.21 0.98  GFRNONAA 73 65 >60  GFRAA 84 75 >60    LIVER FUNCTION TESTS:  Recent Labs  04/26/15 0922 10/19/15 1513  BILITOT 0.3 0.3  AST 16 15  ALT 15 15  ALKPHOS 90 99  PROT 6.4 6.3  ALBUMIN 3.9 4.0    TUMOR MARKERS: No results for input(s): AFPTM, CEA, CA199, CHROMGRNA in the last 8760 hours.  Assessment and Plan:  60 y.o. male  with history of metastatic prostate cancer and HIV who presents today for Port-A-Cath placement for chemotherapy.Risks and benefits discussed with the patient including, but not limited to bleeding, infection, pneumothorax, or fibrin sheath development and need for additional procedures.All of the patient's questions were answered, patient is agreeable to proceed.Consent signed and in chart.      Thank you for this interesting consult.  I greatly enjoyed meeting Dwayne Boone and look forward to participating in their care.  A copy of this report was sent to the requesting provider on this date.  Electronically Signed: D. Rowe Robert 01/25/2016, 9:08 AM   I spent a total of 20 minutes in face to face in clinical consultation, greater than 50% of which was counseling/coordinating care for port a cath placement

## 2016-01-29 ENCOUNTER — Other Ambulatory Visit: Payer: Medicare Other

## 2016-01-29 DIAGNOSIS — B20 Human immunodeficiency virus [HIV] disease: Secondary | ICD-10-CM | POA: Diagnosis not present

## 2016-01-30 ENCOUNTER — Other Ambulatory Visit (HOSPITAL_BASED_OUTPATIENT_CLINIC_OR_DEPARTMENT_OTHER): Payer: Medicare Other

## 2016-01-30 ENCOUNTER — Ambulatory Visit (HOSPITAL_BASED_OUTPATIENT_CLINIC_OR_DEPARTMENT_OTHER): Payer: Medicare Other

## 2016-01-30 ENCOUNTER — Encounter: Payer: Self-pay | Admitting: Medical Oncology

## 2016-01-30 VITALS — BP 120/82 | HR 78 | Temp 98.0°F | Resp 18

## 2016-01-30 DIAGNOSIS — Z5111 Encounter for antineoplastic chemotherapy: Secondary | ICD-10-CM | POA: Diagnosis not present

## 2016-01-30 DIAGNOSIS — C61 Malignant neoplasm of prostate: Secondary | ICD-10-CM

## 2016-01-30 DIAGNOSIS — C7951 Secondary malignant neoplasm of bone: Secondary | ICD-10-CM | POA: Diagnosis not present

## 2016-01-30 DIAGNOSIS — B20 Human immunodeficiency virus [HIV] disease: Secondary | ICD-10-CM

## 2016-01-30 LAB — CBC WITH DIFFERENTIAL/PLATELET
BASO%: 0.4 % (ref 0.0–2.0)
Basophils Absolute: 0 10*3/uL (ref 0.0–0.1)
EOS ABS: 0 10*3/uL (ref 0.0–0.5)
EOS%: 0.4 % (ref 0.0–7.0)
HCT: 44.1 % (ref 38.4–49.9)
HGB: 14.7 g/dL (ref 13.0–17.1)
LYMPH%: 45.7 % (ref 14.0–49.0)
MCH: 32.7 pg (ref 27.2–33.4)
MCHC: 33.4 g/dL (ref 32.0–36.0)
MCV: 97.9 fL (ref 79.3–98.0)
MONO#: 0.5 10*3/uL (ref 0.1–0.9)
MONO%: 10.6 % (ref 0.0–14.0)
NEUT%: 42.9 % (ref 39.0–75.0)
NEUTROS ABS: 1.9 10*3/uL (ref 1.5–6.5)
PLATELETS: 262 10*3/uL (ref 140–400)
RBC: 4.51 10*6/uL (ref 4.20–5.82)
RDW: 14.8 % — ABNORMAL HIGH (ref 11.0–14.6)
WBC: 4.4 10*3/uL (ref 4.0–10.3)
lymph#: 2 10*3/uL (ref 0.9–3.3)

## 2016-01-30 LAB — COMPREHENSIVE METABOLIC PANEL
ALT: 11 U/L (ref 0–55)
ANION GAP: 10 meq/L (ref 3–11)
AST: 15 U/L (ref 5–34)
Albumin: 3.7 g/dL (ref 3.5–5.0)
Alkaline Phosphatase: 138 U/L (ref 40–150)
BUN: 16.9 mg/dL (ref 7.0–26.0)
CHLORIDE: 105 meq/L (ref 98–109)
CO2: 25 meq/L (ref 22–29)
Calcium: 9.8 mg/dL (ref 8.4–10.4)
Creatinine: 1 mg/dL (ref 0.7–1.3)
GLUCOSE: 99 mg/dL (ref 70–140)
POTASSIUM: 3.9 meq/L (ref 3.5–5.1)
SODIUM: 139 meq/L (ref 136–145)
TOTAL PROTEIN: 7.3 g/dL (ref 6.4–8.3)
Total Bilirubin: 0.32 mg/dL (ref 0.20–1.20)

## 2016-01-30 LAB — T-HELPER CELL (CD4) - (RCID CLINIC ONLY)
CD4 % Helper T Cell: 28 % — ABNORMAL LOW (ref 33–55)
CD4 T Cell Abs: 600 /uL (ref 400–2700)

## 2016-01-30 MED ORDER — DEXAMETHASONE SODIUM PHOSPHATE 10 MG/ML IJ SOLN
10.0000 mg | Freq: Once | INTRAMUSCULAR | Status: AC
  Administered 2016-01-30: 10 mg via INTRAVENOUS

## 2016-01-30 MED ORDER — SODIUM CHLORIDE 0.9 % IV SOLN
75.0000 mg/m2 | Freq: Once | INTRAVENOUS | Status: AC
  Administered 2016-01-30: 130 mg via INTRAVENOUS
  Filled 2016-01-30: qty 13

## 2016-01-30 MED ORDER — SODIUM CHLORIDE 0.9% FLUSH
10.0000 mL | INTRAVENOUS | Status: DC | PRN
  Filled 2016-01-30: qty 10

## 2016-01-30 MED ORDER — DEXAMETHASONE SODIUM PHOSPHATE 10 MG/ML IJ SOLN
INTRAMUSCULAR | Status: AC
  Filled 2016-01-30: qty 1

## 2016-01-30 MED ORDER — SODIUM CHLORIDE 0.9 % IV SOLN
Freq: Once | INTRAVENOUS | Status: AC
  Administered 2016-01-30: 10:00:00 via INTRAVENOUS

## 2016-01-30 MED ORDER — HEPARIN SOD (PORK) LOCK FLUSH 100 UNIT/ML IV SOLN
500.0000 [IU] | Freq: Once | INTRAVENOUS | Status: DC | PRN
  Filled 2016-01-30: qty 5

## 2016-01-30 NOTE — Progress Notes (Signed)
Patient completed first time Taxotere infusion with no issues/complaints. Discharge AVS reviewed in detail with pt. Teach back completed.

## 2016-01-30 NOTE — Patient Instructions (Addendum)
[facility] Discharge Instructions for Patients Receiving Chemotherapy  Today you received the following chemotherapy agents Taxotere  To help prevent nausea and vomiting after your treatment, we encourage you to take your nausea medication: Compazine 10 mg every 6 hours as needed for nausea.   If you develop nausea and vomiting that is not controlled by your nausea medication, call the clinic.   BELOW ARE SYMPTOMS THAT SHOULD BE REPORTED IMMEDIATELY:  *FEVER GREATER THAN 100.5 F  *CHILLS WITH OR WITHOUT FEVER  NAUSEA AND VOMITING THAT IS NOT CONTROLLED WITH YOUR NAUSEA MEDICATION  *UNUSUAL SHORTNESS OF BREATH  *UNUSUAL BRUISING OR BLEEDING  TENDERNESS IN MOUTH AND THROAT WITH OR WITHOUT PRESENCE OF ULCERS  *URINARY PROBLEMS  *BOWEL PROBLEMS  UNUSUAL RASH Items with * indicate a potential emergency and should be followed up as soon as possible.  Feel free to call the clinic you have any questions or concerns. The clinic phone number is [phone].  Please show the [facility] at check-in to the Emergency Department and triage nurse.   Docetaxel injection What is this medicine? DOCETAXEL (doe se TAX el) is a chemotherapy drug. It targets fast dividing cells, like cancer cells, and causes these cells to die. This medicine is used to treat many types of cancers like breast cancer, certain stomach cancers, head and neck cancer, lung cancer, and prostate cancer. This medicine may be used for other purposes; ask your health care provider or pharmacist if you have questions. What should I tell my health care provider before I take this medicine? They need to know if you have any of these conditions: -infection (especially a virus infection such as chickenpox, cold sores, or herpes) -liver disease -low blood counts, like low white cell, platelet, or red cell counts -an unusual or allergic reaction to docetaxel, polysorbate 80, other chemotherapy agents,  other medicines, foods, dyes, or preservatives -pregnant or trying to get pregnant -breast-feeding How should I use this medicine? This drug is given as an infusion into a vein. It is administered in a hospital or clinic by a specially trained health care professional. Talk to your pediatrician regarding the use of this medicine in children. Special care may be needed. Overdosage: If you think you have taken too much of this medicine contact a poison control center or emergency room at once. NOTE: This medicine is only for you. Do not share this medicine with others. What if I miss a dose? It is important not to miss your dose. Call your doctor or health care professional if you are unable to keep an appointment. What may interact with this medicine? -cyclosporine -erythromycin -ketoconazole -medicines to increase blood counts like filgrastim, pegfilgrastim, sargramostim -vaccines Talk to your doctor or health care professional before taking any of these medicines: -acetaminophen -aspirin -ibuprofen -ketoprofen -naproxen This list may not describe all possible interactions. Give your health care provider a list of all the medicines, herbs, non-prescription drugs, or dietary supplements you use. Also tell them if you smoke, drink alcohol, or use illegal drugs. Some items may interact with your medicine. What should I watch for while using this medicine? Your condition will be monitored carefully while you are receiving this medicine. You will need important blood work done while you are taking this medicine. This drug may make you feel generally unwell. This is not uncommon, as chemotherapy can affect healthy cells as well as cancer cells. Report any side effects. Continue your course of treatment even though you  feel ill unless your doctor tells you to stop. In some cases, you may be given additional medicines to help with side effects. Follow all directions for their use. Call your doctor  or health care professional for advice if you get a fever, chills or sore throat, or other symptoms of a cold or flu. Do not treat yourself. This drug decreases your body's ability to fight infections. Try to avoid being around people who are sick. This medicine may increase your risk to bruise or bleed. Call your doctor or health care professional if you notice any unusual bleeding. This medicine may contain alcohol in the product. You may get drowsy or dizzy. Do not drive, use machinery, or do anything that needs mental alertness until you know how this medicine affects you. Do not stand or sit up quickly, especially if you are an older patient. This reduces the risk of dizzy or fainting spells. Avoid alcoholic drinks. Do not become pregnant while taking this medicine. Women should inform their doctor if they wish to become pregnant or think they might be pregnant. There is a potential for serious side effects to an unborn child. Talk to your health care professional or pharmacist for more information. Do not breast-feed an infant while taking this medicine. What side effects may I notice from receiving this medicine? Side effects that you should report to your doctor or health care professional as soon as possible: -allergic reactions like skin rash, itching or hives, swelling of the face, lips, or tongue -low blood counts - This drug may decrease the number of white blood cells, red blood cells and platelets. You may be at increased risk for infections and bleeding. -signs of infection - fever or chills, cough, sore throat, pain or difficulty passing urine -signs of decreased platelets or bleeding - bruising, pinpoint red spots on the skin, black, tarry stools, nosebleeds -signs of decreased red blood cells - unusually weak or tired, fainting spells, lightheadedness -breathing problems -fast or irregular heartbeat -low blood pressure -mouth sores -nausea and vomiting -pain, swelling, redness or  irritation at the injection site -pain, tingling, numbness in the hands or feet -swelling of the ankle, feet, hands -weight gain Side effects that usually do not require medical attention (report to your prescriber or health care professional if they continue or are bothersome): -bone pain -complete hair loss including hair on your head, underarms, pubic hair, eyebrows, and eyelashes -diarrhea -excessive tearing -changes in the color of fingernails -loosening of the fingernails -nausea -muscle pain -red flush to skin -sweating -weak or tired This list may not describe all possible side effects. Call your doctor for medical advice about side effects. You may report side effects to FDA at 1-800-FDA-1088. Where should I keep my medicine? This drug is given in a hospital or clinic and will not be stored at home. NOTE: This sheet is a summary. It may not cover all possible information. If you have questions about this medicine, talk to your doctor, pharmacist, or health care provider.    2016, Elsevier/Gold Standard. (2014-04-11 16:04:57)

## 2016-01-31 LAB — PSA: Prostate Specific Ag, Serum: 39.7 ng/mL — ABNORMAL HIGH (ref 0.0–4.0)

## 2016-01-31 LAB — HIV-1 RNA QUANT-NO REFLEX-BLD
HIV 1 RNA Quant: 74 copies/mL — ABNORMAL HIGH (ref <20)
HIV-1 RNA Quant, Log: 1.87 Log copies/mL — ABNORMAL HIGH (ref <1.30)

## 2016-02-01 ENCOUNTER — Ambulatory Visit (HOSPITAL_BASED_OUTPATIENT_CLINIC_OR_DEPARTMENT_OTHER): Payer: Medicare Other

## 2016-02-01 VITALS — BP 131/84 | HR 99 | Temp 97.8°F | Resp 20

## 2016-02-01 DIAGNOSIS — C61 Malignant neoplasm of prostate: Secondary | ICD-10-CM

## 2016-02-01 DIAGNOSIS — C7951 Secondary malignant neoplasm of bone: Secondary | ICD-10-CM

## 2016-02-01 MED ORDER — PEGFILGRASTIM INJECTION 6 MG/0.6ML ~~LOC~~
6.0000 mg | PREFILLED_SYRINGE | Freq: Once | SUBCUTANEOUS | Status: AC
  Administered 2016-02-01: 6 mg via SUBCUTANEOUS
  Filled 2016-02-01: qty 0.6

## 2016-02-01 NOTE — Patient Instructions (Signed)
Pegfilgrastim injection What is this medicine? PEGFILGRASTIM (PEG fil gra stim) is a long-acting granulocyte colony-stimulating factor that stimulates the growth of neutrophils, a type of white blood cell important in the body's fight against infection. It is used to reduce the incidence of fever and infection in patients with certain types of cancer who are receiving chemotherapy that affects the bone marrow, and to increase survival after being exposed to high doses of radiation. This medicine may be used for other purposes; ask your health care provider or pharmacist if you have questions. What should I tell my health care provider before I take this medicine? They need to know if you have any of these conditions: -kidney disease -latex allergy -ongoing radiation therapy -sickle cell disease -skin reactions to acrylic adhesives (On-Body Injector only) -an unusual or allergic reaction to pegfilgrastim, filgrastim, other medicines, foods, dyes, or preservatives -pregnant or trying to get pregnant -breast-feeding How should I use this medicine? This medicine is for injection under the skin. If you get this medicine at home, you will be taught how to prepare and give the pre-filled syringe or how to use the On-body Injector. Refer to the patient Instructions for Use for detailed instructions. Use exactly as directed. Take your medicine at regular intervals. Do not take your medicine more often than directed. It is important that you put your used needles and syringes in a special sharps container. Do not put them in a trash can. If you do not have a sharps container, call your pharmacist or healthcare provider to get one. Talk to your pediatrician regarding the use of this medicine in children. While this drug may be prescribed for selected conditions, precautions do apply. Overdosage: If you think you have taken too much of this medicine contact a poison control center or emergency room at  once. NOTE: This medicine is only for you. Do not share this medicine with others. What if I miss a dose? It is important not to miss your dose. Call your doctor or health care professional if you miss your dose. If you miss a dose due to an On-body Injector failure or leakage, a new dose should be administered as soon as possible using a single prefilled syringe for manual use. What may interact with this medicine? Interactions have not been studied. Give your health care provider a list of all the medicines, herbs, non-prescription drugs, or dietary supplements you use. Also tell them if you smoke, drink alcohol, or use illegal drugs. Some items may interact with your medicine. This list may not describe all possible interactions. Give your health care provider a list of all the medicines, herbs, non-prescription drugs, or dietary supplements you use. Also tell them if you smoke, drink alcohol, or use illegal drugs. Some items may interact with your medicine. What should I watch for while using this medicine? You may need blood work done while you are taking this medicine. If you are going to need a MRI, CT scan, or other procedure, tell your doctor that you are using this medicine (On-Body Injector only). What side effects may I notice from receiving this medicine? Side effects that you should report to your doctor or health care professional as soon as possible: -allergic reactions like skin rash, itching or hives, swelling of the face, lips, or tongue -dizziness -fever -pain, redness, or irritation at site where injected -pinpoint red spots on the skin -red or dark-brown urine -shortness of breath or breathing problems -stomach or side pain, or pain   at the shoulder -swelling -tiredness -trouble passing urine or change in the amount of urine Side effects that usually do not require medical attention (report to your doctor or health care professional if they continue or are  bothersome): -bone pain -muscle pain This list may not describe all possible side effects. Call your doctor for medical advice about side effects. You may report side effects to FDA at 1-800-FDA-1088. Where should I keep my medicine? Keep out of the reach of children. Store pre-filled syringes in a refrigerator between 2 and 8 degrees C (36 and 46 degrees F). Do not freeze. Keep in carton to protect from light. Throw away this medicine if it is left out of the refrigerator for more than 48 hours. Throw away any unused medicine after the expiration date. NOTE: This sheet is a summary. It may not cover all possible information. If you have questions about this medicine, talk to your doctor, pharmacist, or health care provider.    2016, Elsevier/Gold Standard. (2014-04-14 14:30:14)  

## 2016-02-06 ENCOUNTER — Ambulatory Visit (INDEPENDENT_AMBULATORY_CARE_PROVIDER_SITE_OTHER): Payer: Medicare Other | Admitting: Infectious Disease

## 2016-02-06 ENCOUNTER — Encounter: Payer: Self-pay | Admitting: Infectious Disease

## 2016-02-06 VITALS — BP 106/61 | HR 116 | Temp 97.2°F | Wt 134.0 lb

## 2016-02-06 DIAGNOSIS — B20 Human immunodeficiency virus [HIV] disease: Secondary | ICD-10-CM

## 2016-02-06 DIAGNOSIS — Z23 Encounter for immunization: Secondary | ICD-10-CM | POA: Diagnosis not present

## 2016-02-06 DIAGNOSIS — R972 Elevated prostate specific antigen [PSA]: Secondary | ICD-10-CM | POA: Diagnosis not present

## 2016-02-06 DIAGNOSIS — C61 Malignant neoplasm of prostate: Secondary | ICD-10-CM | POA: Diagnosis not present

## 2016-02-06 NOTE — Progress Notes (Signed)
Chief complaint: he is awaiting back results from his prostate biopsy  Subjective:    Patient ID: Dwayne Boone, male    DOB: [birth year], 60 y.o.   MRN: [id number]  HPI  60 year old AA man with history of HIV, AIDs and complicated ARV history with  healthy CD4 and previously  suppressed  withsimple regimen of PREZCOBIX, Tivicay and Lamivudine.  His treatment hx was as follows based on prior note:  He was diagnosed in early 1990s and initially on regimen of AZT/3TC, then entered ACTG 372 in 1997 which consisted of [id number] (ABacavir) 300mg  twice daily, DMP 266 (Efavirenz) 600 mg twice daily, Adefovir 120mg  daily, L carinitine and Nelfinavir 750 twice daily vs placebo twice daily. While on this regimen he initially suppressed his virus but then failed with VL nearly 170K in Spring of 1998. Dr. Orene Desanctis changed him to Didanosine 200mg  twice daily, Stavudine (zerit) 40mg  twice daily, norvir 400mg  twice daily, and saquinavir 400mg  twice daily. He was then changed in 01/1997 to Epivir 150mg  twice dialy with continuation of the zerit, norvir and saquinavir, in 2001 he had kaletra swapped in for the saquinavir and high dose norvir. He had done quite well with this  regimen with excellent viroogical suppression and immune reconstitution. I changed him in 2010 to [place], Norvir and Zerit, then Prezista, Norvir and Isentress with nice suppression and then Prezista, Norvir and Tivicay, Epivir and then Prezcobix, Tivicay and Epivir--> to ODEFSEY and Tivicay after we had obatined Product/process development scientist showing         Lab Results  Component Value Date   HIV1RNAQUANT 74 (H) 01/29/2016   HIV1RNAQUANT <20 10/19/2015   HIV1RNAQUANT <20 04/26/2015      Lab Results  Component Value Date   CD4TABS 600 01/29/2016   CD4TABS 680 10/19/2015   CD4TABS 530 04/26/2015    Dona pointed to Gonzalez correctly with chart but believed his bottle was labelled with Descovy. I had him meet with [facility] pharmacy to  clarify this.  He has started chemotherapy for his prostate cancer with taxotere.   Past Medical History:  Diagnosis Date  . AIDS ([place])   . Depression   . Hyperlipidemia 12/13/2014  . 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 Neuropathy ([place])   . PSA elevation 12/04/2015  . Weight loss    Past Surgical History:  Procedure Laterality Date  . IR GENERIC HISTORICAL  01/25/2016   IR FLUORO GUIDE PORT INSERTION RIGHT 01/25/2016 [name], MD [facility]  . IR GENERIC HISTORICAL  01/25/2016   IR US GUIDE VASC ACCESS RIGHT 01/25/2016 [name], MD [facility]  . none     Family History  Problem Relation Age of Onset  . CVA Mother   . Hypertension Mother    Social History  Substance Use Topics  . Smoking status: Current Every Day Smoker    Packs/day: 0.50    Types: Cigarettes  . Smokeless tobacco: Never Used     Comment: 8 daily  . Alcohol use No     Review of Systems  Constitutional: Negative for activity change, chills, diaphoresis, fatigue, fever and unexpected weight change.  HENT: Negative for congestion, rhinorrhea, sinus pressure, sneezing, sore throat and trouble swallowing.   Eyes: Negative for photophobia and visual disturbance.  Respiratory: Negative for cough, chest tightness, shortness of breath, wheezing and stridor.   Cardiovascular: Negative for chest pain, palpitations and leg swelling.  Gastrointestinal: Negative for abdominal distention, abdominal pain, anal  bleeding, blood in stool, constipation, diarrhea, nausea and vomiting.  Genitourinary: Negative for dysuria, flank pain and hematuria.  Musculoskeletal: Negative for arthralgias, back pain, gait problem, joint swelling and myalgias.  Skin: Negative for color change, pallor, rash and wound.  Neurological: Negative for dizziness, tremors, weakness, light-headedness and numbness.  Hematological: Negative for adenopathy. Does not bruise/bleed easily.  Psychiatric/Behavioral: Negative  for agitation, behavioral problems, confusion, decreased concentration, dysphoric mood and sleep disturbance.       Objective:   Physical Exam  Constitutional: He is oriented to person, place, and time. He appears well-developed and well-nourished. No distress.  HENT:  Head: Normocephalic and atraumatic.  Mouth/Throat: Oropharynx is clear and moist. No oropharyngeal exudate.  Eyes: Conjunctivae and EOM are normal. Pupils are equal, round, and reactive to light. No scleral icterus.  Neck: Normal range of motion. Neck supple. No JVD present.  Cardiovascular: Normal rate and regular rhythm.   Pulmonary/Chest: Effort normal and breath sounds normal. No respiratory distress. He has no wheezes.  Abdominal: He exhibits no distension and no mass. There is no tenderness. There is no rebound and no guarding.  Musculoskeletal: He exhibits no edema.  Lymphadenopathy:    He has no cervical adenopathy.  Neurological: He is alert and oriented to person, place, and time. He exhibits normal muscle tone. Coordination normal.  Skin: Skin is warm and dry. He is not diaphoretic. No erythema. No pallor.  Psychiatric: He has a normal mood and affect. His behavior is normal. Judgment and thought content normal.  Nursing note and vitals reviewed.         Assessment & Plan:  HIV: continue  Tivicay and ODEFSEY  Likely prostate cancer: taxotere   I spent greater than 25 minutes with the patient including greater than 50% of time in face to face counsel of the patient  Regarding his HIV AIDS, his prostate cancer and in coordination of his  care.

## 2016-02-12 ENCOUNTER — Telehealth: Payer: Self-pay | Admitting: *Deleted

## 2016-02-12 NOTE — Telephone Encounter (Signed)
Spoke with patient, he states he received his lupron injection last week from his urologist

## 2016-02-12 NOTE — Telephone Encounter (Signed)
-----   Message from Arna Snipe, RN sent at 01/30/2016  3:58 PM EDT ----- Regarding: Patient question Dwayne Boone was asking about his "monthly injection" that he gets from his Urologist. His other MD said that he will get it at chemo times. I'm not sure but he may be talking about Lupron.

## 2016-02-20 ENCOUNTER — Telehealth: Payer: Self-pay | Admitting: *Deleted

## 2016-02-20 ENCOUNTER — Ambulatory Visit (HOSPITAL_BASED_OUTPATIENT_CLINIC_OR_DEPARTMENT_OTHER): Payer: Medicare Other | Admitting: Oncology

## 2016-02-20 ENCOUNTER — Telehealth: Payer: Self-pay | Admitting: Oncology

## 2016-02-20 ENCOUNTER — Other Ambulatory Visit (HOSPITAL_BASED_OUTPATIENT_CLINIC_OR_DEPARTMENT_OTHER): Payer: Medicare Other

## 2016-02-20 ENCOUNTER — Ambulatory Visit (HOSPITAL_BASED_OUTPATIENT_CLINIC_OR_DEPARTMENT_OTHER): Payer: Medicare Other

## 2016-02-20 VITALS — BP 120/70 | HR 98 | Temp 98.2°F | Resp 18 | Ht 71.0 in | Wt 135.1 lb

## 2016-02-20 DIAGNOSIS — Z5111 Encounter for antineoplastic chemotherapy: Secondary | ICD-10-CM

## 2016-02-20 DIAGNOSIS — C61 Malignant neoplasm of prostate: Secondary | ICD-10-CM

## 2016-02-20 DIAGNOSIS — C786 Secondary malignant neoplasm of retroperitoneum and peritoneum: Secondary | ICD-10-CM | POA: Diagnosis not present

## 2016-02-20 DIAGNOSIS — B20 Human immunodeficiency virus [HIV] disease: Secondary | ICD-10-CM | POA: Diagnosis not present

## 2016-02-20 DIAGNOSIS — C7951 Secondary malignant neoplasm of bone: Secondary | ICD-10-CM

## 2016-02-20 LAB — COMPREHENSIVE METABOLIC PANEL
ALT: 19 U/L (ref 0–55)
ANION GAP: 9 meq/L (ref 3–11)
AST: 16 U/L (ref 5–34)
Albumin: 3.3 g/dL — ABNORMAL LOW (ref 3.5–5.0)
Alkaline Phosphatase: 125 U/L (ref 40–150)
BILIRUBIN TOTAL: 0.25 mg/dL (ref 0.20–1.20)
BUN: 19.6 mg/dL (ref 7.0–26.0)
CALCIUM: 10 mg/dL (ref 8.4–10.4)
CO2: 24 meq/L (ref 22–29)
CREATININE: 0.9 mg/dL (ref 0.7–1.3)
Chloride: 109 mEq/L (ref 98–109)
EGFR: >90 mL/min/{1.73_m2} (ref >90)
Glucose: 113 mg/dl (ref 70–140)
Potassium: 4.5 mEq/L (ref 3.5–5.1)
Sodium: 143 mEq/L (ref 136–145)
TOTAL PROTEIN: 7 g/dL (ref 6.4–8.3)

## 2016-02-20 LAB — CBC WITH DIFFERENTIAL/PLATELET
BASO%: 0.5 % (ref 0.0–2.0)
Basophils Absolute: 0 10*3/uL (ref 0.0–0.1)
EOS%: 0.1 % (ref 0.0–7.0)
Eosinophils Absolute: 0 10*3/uL (ref 0.0–0.5)
HCT: 41.1 % (ref 38.4–49.9)
HGB: 13.6 g/dL (ref 13.0–17.1)
LYMPH#: 1.9 10*3/uL (ref 0.9–3.3)
LYMPH%: 23.5 % (ref 14.0–49.0)
MCH: 32.5 pg (ref 27.2–33.4)
MCHC: 33.1 g/dL (ref 32.0–36.0)
MCV: 98.3 fL — ABNORMAL HIGH (ref 79.3–98.0)
MONO#: 1 10*3/uL — AB (ref 0.1–0.9)
MONO%: 12.6 % (ref 0.0–14.0)
NEUT%: 63.3 % (ref 39.0–75.0)
NEUTROS ABS: 5.1 10*3/uL (ref 1.5–6.5)
PLATELETS: 437 10*3/uL — AB (ref 140–400)
RBC: 4.18 10*6/uL — AB (ref 4.20–5.82)
RDW: 15.4 % — ABNORMAL HIGH (ref 11.0–14.6)
WBC: 8 10*3/uL (ref 4.0–10.3)

## 2016-02-20 MED ORDER — DOCETAXEL CHEMO INJECTION 160 MG/16ML
75.0000 mg/m2 | Freq: Once | INTRAVENOUS | Status: AC
  Administered 2016-02-20: 130 mg via INTRAVENOUS
  Filled 2016-02-20: qty 13

## 2016-02-20 MED ORDER — SODIUM CHLORIDE 0.9% FLUSH
10.0000 mL | INTRAVENOUS | Status: DC | PRN
  Administered 2016-02-20: 10 mL
  Filled 2016-02-20: qty 10

## 2016-02-20 MED ORDER — DEXAMETHASONE SODIUM PHOSPHATE 10 MG/ML IJ SOLN
INTRAMUSCULAR | Status: AC
  Filled 2016-02-20: qty 1

## 2016-02-20 MED ORDER — HEPARIN SOD (PORK) LOCK FLUSH 100 UNIT/ML IV SOLN
500.0000 [IU] | Freq: Once | INTRAVENOUS | Status: AC | PRN
  Administered 2016-02-20: 500 [IU]
  Filled 2016-02-20: qty 5

## 2016-02-20 MED ORDER — SODIUM CHLORIDE 0.9 % IV SOLN
Freq: Once | INTRAVENOUS | Status: AC
  Administered 2016-02-20: 10:00:00 via INTRAVENOUS

## 2016-02-20 MED ORDER — DEXAMETHASONE SODIUM PHOSPHATE 10 MG/ML IJ SOLN
10.0000 mg | Freq: Once | INTRAMUSCULAR | Status: AC
  Administered 2016-02-20: 10 mg via INTRAVENOUS

## 2016-02-20 NOTE — Telephone Encounter (Signed)
Message sent to chemo scheduler to be added, per 02/20/16 los.

## 2016-02-20 NOTE — Telephone Encounter (Signed)
Appointments scheduled per 02/20/16 los.  Copy of  AVS report and appointment schedule was given to patient, per 02/20/16 los. ° °

## 2016-02-20 NOTE — Patient Instructions (Signed)
[facility] Discharge Instructions for Patients Receiving Chemotherapy  Today you received the following chemotherapy agents: Taxotere.  To help prevent nausea and vomiting after your treatment, we encourage you to take your nausea medication: Compazine 10 mg every 6 hours as needed for nausea.   If you develop nausea and vomiting that is not controlled by your nausea medication, call the clinic.   BELOW ARE SYMPTOMS THAT SHOULD BE REPORTED IMMEDIATELY:  *FEVER GREATER THAN 100.5 F  *CHILLS WITH OR WITHOUT FEVER  NAUSEA AND VOMITING THAT IS NOT CONTROLLED WITH YOUR NAUSEA MEDICATION  *UNUSUAL SHORTNESS OF BREATH  *UNUSUAL BRUISING OR BLEEDING  TENDERNESS IN MOUTH AND THROAT WITH OR WITHOUT PRESENCE OF ULCERS  *URINARY PROBLEMS  *BOWEL PROBLEMS  UNUSUAL RASH Items with * indicate a potential emergency and should be followed up as soon as possible.  Feel free to call the clinic you have any questions or concerns. The clinic phone number is [phone].  Please show the [facility] at check-in to the Emergency Department and triage nurse.

## 2016-02-20 NOTE — Telephone Encounter (Signed)
Per LOS I have scheduled appt and notified the scheduler 

## 2016-02-20 NOTE — Progress Notes (Signed)
Hematology and Oncology Follow Up Visit  Dwayne Boone [id number] 1955-09-02 60 y.o. 02/20/2016 8:19 AM No PCP Per PatientNo ref. provider found   Principle Diagnosis: 60 year old with prostate cancer diagnosed in 2017. He presented with a PSA of 451 and a Gleason score 4+5 = 9. He has high volume disease with all 12 cores of his prostate involved with high-grade cancer. His staging workup showed metastatic disease including retroperitoneal adenopathy as well as diffuse bony metastasis   Prior Therapy: He underwent a prostate biopsy on 12/01/2015. The biopsy showed prostate adenocarcinoma with high-volume disease in all 12 cores. His Gleason score was 4+5 = 9 and the majority of the cores with few cores that had 4+4 = 8.  Current therapy:  Androgen deprivation therapy under the care of Dr.Budzyn at [facility]. Taxotere chemotherapy at 75 mg/m started on 01/30/2016. Chemotherapy will be given every 3 weeks for a total of 6 treatments. He is here for cycle 2.  Interim History: [name] presents today for a follow-up visit. Since the last visit, he received first cycle of chemotherapy and tolerated it well. He denied any nausea, vomiting or dyspepsia. He denied any excessive fatigue or tiredness. He denied any peripheral neuropathy. He denied any infusion-related complications. He continues to attend to her activities of daily living including driving. He did notice increase in left-sided hip pain in the last 3 weeks. He was prescribed hydrocodone as well as received an injection in that hip. He denied any falls or fractures. He denied any trauma. He denied any neurological deficits and continues to ambulate without difficulties. He reports hydrocodone have helped his pain.  He denied any headaches, blurry vision, syncope or seizures. He does not report any fevers, chills or sweats. He does not report any chest pain, palpitation, orthopnea or leg edema. He does not report any cough, wheezing  or hemoptysis. He does not report any nausea, vomiting, abdominal pain but does report occasional constipation. He does not report any hematuria or dysuria. He does not report any skeletal complaints of arthralgias or myalgias. Remaining review of systems unremarkable.   Medications: I have reviewed the patient's current medications.  Current Outpatient Prescriptions  Medication Sig Dispense Refill  . dolutegravir (TIVICAY) 50 MG tablet Take 1 tablet (50 mg total) by mouth daily. 30 tablet 11  . emtricitabine-rilpivir-tenofovir AF (ODEFSEY) 200-25-25 MG TABS tablet Take 1 tablet by mouth daily with breakfast. 30 tablet 11  . finasteride (PROSCAR) 5 MG tablet Take 5 mg by mouth daily.  11  . lidocaine-prilocaine (EMLA) cream Apply 1 application topically as needed. 30 g 0  . pravastatin (PRAVACHOL) 40 MG tablet Take 1 tablet (40 mg total) by mouth daily. 30 tablet 11  . prochlorperazine (COMPAZINE) 10 MG tablet Take 1 tablet (10 mg total) by mouth every 6 (six) hours as needed for nausea or vomiting. 30 tablet 0  . tamsulosin (FLOMAX) 0.4 MG CAPS capsule Take 1 capsule (0.4 mg total) by mouth daily after breakfast. 30 capsule 11   No current facility-administered medications for this visit.      Allergies: No Known Allergies  Past Medical History, Surgical history, Social history, and Family History were reviewed and updated.   Physical Exam: Blood pressure 120/70, pulse 98, temperature 98.2 F (36.8 C), temperature source Oral, resp. rate 18, height 5\' 11"  (1.803 m), weight 135 lb 1.6 oz (61.3 kg), SpO2 100 %. ECOG: 0 General appearance: alert and cooperative appeared without distress. Head: Normocephalic, without obvious abnormality no  oral ulcers or lesions. Neck: no adenopathy Lymph nodes: Cervical, supraclavicular, and axillary nodes normal. Heart:regular rate and rhythm, S1, S2 normal, no murmur, click, rub or gallop Lung:chest clear, no wheezing, rales, normal symmetric air  entry Abdomin: soft, non-tender, without masses or organomegaly no shifting dullness or ascites. EXT:no erythema, induration, or nodules. He had good range of motion in his left hip. No point tenderness. Neurological examination: He had no deficits. He was able to ambulate without difficulties.  Lab Results: Lab Results  Component Value Date   WBC 8.0 02/20/2016   HGB 13.6 02/20/2016   HCT 41.1 02/20/2016   MCV 98.3 (H) 02/20/2016   PLT 437 (H) 02/20/2016     Chemistry      Component Value Date/Time   NA 139 01/30/2016 0934   K 3.9 01/30/2016 0934   CL 107 01/25/2016 0810   CO2 25 01/30/2016 0934   BUN 16.9 01/30/2016 0934   CREATININE 1.0 01/30/2016 0934      Component Value Date/Time   CALCIUM 9.8 01/30/2016 0934   ALKPHOS 138 01/30/2016 0934   AST 15 01/30/2016 0934   ALT 11 01/30/2016 0934   BILITOT 0.32 01/30/2016 0934     Results for PURCELL, RHODD (MRN [id number]) as of 02/20/2016 07:54  Ref. Range 10/19/2015 14:57 01/30/2016 09:34  PSA Latest Ref Range: 0.0 - 4.0 ng/mL 441.90 (H) 39.7 (H)    Radiological Studies: IMPRESSION: Numerous metastatic bone lesions involving the axial skeleton.  Impression and Plan:  60 year old gentleman with the following issues:  1. Prostate cancer diagnosed in August 2017. He presented with a PSA of 451 and a Gleason score 4+5 = 9. He has high volume disease with all 12 cores of his prostate involved with high-grade cancer. His staging workup showed metastatic disease including retroperitoneal adenopathy as well as diffuse bony metastasis. He has been started on androgen deprivation with Lupron.  He is currently receiving Taxotere chemotherapy and tolerated the first cycle without complications. The plan is to proceed with cycle 2 without any dose reduction or delay. The plan is to give him a total of 6 cycles. His PSA already showed excellent response to therapy dropping to 39 from 441.  2. IV access: Port-A-Cath inserted without  complications. This will be utilized for chemotherapy moving forward.  3. Androgen deprivation: He continues to receive Lupron and will continue indefinitely. The  4. Androgen directed therapy: He has not received Xgeva at this time awaiting dental clearance. He has been evaluated by his dentist and will require likely acute canal. I have advised against proceeding with any dental surgery wall on chemotherapy.  5. Antiemetics: Prescription for Compazine available to the patient. He did not experience any nausea or vomiting .  7. HIV: He is followed by Dr.Van Dam . His regimen has been adjusted to accommodate Taxotere chemotherapy.  8. Hip pain: Unclear etiology but could be related to metastatic prostate cancer. His bone scan was personally reviewed today and discussed with the patient. He does have numerous bone lesions with very little involvement of the hip. If there is no improvement noted with the current measures, we will consider radiation therapy evaluation to palliate his pain.  9. Follow-up: Will be in 3 weeks for the next cycle of chemotherapy.      [facility], MD 11/14/20178:19 AM

## 2016-02-21 ENCOUNTER — Telehealth: Payer: Self-pay

## 2016-02-21 LAB — PSA: Prostate Specific Ag, Serum: 17.3 ng/mL — ABNORMAL HIGH (ref 0.0–4.0)

## 2016-02-21 NOTE — Telephone Encounter (Signed)
S/w pt per Dr Alen Blew OK to go ahead with dental work.

## 2016-02-21 NOTE — Telephone Encounter (Signed)
Pt called stating he is going to schedule a root canal and dental work. He is asking if this is OK during chemo. He is able to postpone dental work to after chemo if needed.

## 2016-02-21 NOTE — Progress Notes (Signed)
No problem.

## 2016-02-21 NOTE — Telephone Encounter (Signed)
This was discussed with his dentist and he is ok to proceed. He will be given antibiotics by his dentist.

## 2016-02-22 ENCOUNTER — Ambulatory Visit (HOSPITAL_BASED_OUTPATIENT_CLINIC_OR_DEPARTMENT_OTHER): Payer: Medicare Other

## 2016-02-22 VITALS — BP 110/80 | HR 97 | Temp 97.7°F | Resp 20

## 2016-02-22 DIAGNOSIS — C61 Malignant neoplasm of prostate: Secondary | ICD-10-CM

## 2016-02-22 DIAGNOSIS — C7951 Secondary malignant neoplasm of bone: Secondary | ICD-10-CM | POA: Diagnosis not present

## 2016-02-22 MED ORDER — PEGFILGRASTIM INJECTION 6 MG/0.6ML ~~LOC~~
6.0000 mg | PREFILLED_SYRINGE | Freq: Once | SUBCUTANEOUS | Status: AC
  Administered 2016-02-22: 6 mg via SUBCUTANEOUS
  Filled 2016-02-22: qty 0.6

## 2016-02-22 NOTE — Patient Instructions (Signed)
Pegfilgrastim injection What is this medicine? PEGFILGRASTIM (PEG fil gra stim) is a long-acting granulocyte colony-stimulating factor that stimulates the growth of neutrophils, a type of white blood cell important in the body's fight against infection. It is used to reduce the incidence of fever and infection in patients with certain types of cancer who are receiving chemotherapy that affects the bone marrow, and to increase survival after being exposed to high doses of radiation. This medicine may be used for other purposes; ask your health care provider or pharmacist if you have questions. What should I tell my health care provider before I take this medicine? They need to know if you have any of these conditions: -kidney disease -latex allergy -ongoing radiation therapy -sickle cell disease -skin reactions to acrylic adhesives (On-Body Injector only) -an unusual or allergic reaction to pegfilgrastim, filgrastim, other medicines, foods, dyes, or preservatives -pregnant or trying to get pregnant -breast-feeding How should I use this medicine? This medicine is for injection under the skin. If you get this medicine at home, you will be taught how to prepare and give the pre-filled syringe or how to use the On-body Injector. Refer to the patient Instructions for Use for detailed instructions. Use exactly as directed. Take your medicine at regular intervals. Do not take your medicine more often than directed. It is important that you put your used needles and syringes in a special sharps container. Do not put them in a trash can. If you do not have a sharps container, call your pharmacist or healthcare provider to get one. Talk to your pediatrician regarding the use of this medicine in children. While this drug may be prescribed for selected conditions, precautions do apply. Overdosage: If you think you have taken too much of this medicine contact a poison control center or emergency room at  once. NOTE: This medicine is only for you. Do not share this medicine with others. What if I miss a dose? It is important not to miss your dose. Call your doctor or health care professional if you miss your dose. If you miss a dose due to an On-body Injector failure or leakage, a new dose should be administered as soon as possible using a single prefilled syringe for manual use. What may interact with this medicine? Interactions have not been studied. Give your health care provider a list of all the medicines, herbs, non-prescription drugs, or dietary supplements you use. Also tell them if you smoke, drink alcohol, or use illegal drugs. Some items may interact with your medicine. This list may not describe all possible interactions. Give your health care provider a list of all the medicines, herbs, non-prescription drugs, or dietary supplements you use. Also tell them if you smoke, drink alcohol, or use illegal drugs. Some items may interact with your medicine. What should I watch for while using this medicine? You may need blood work done while you are taking this medicine. If you are going to need a MRI, CT scan, or other procedure, tell your doctor that you are using this medicine (On-Body Injector only). What side effects may I notice from receiving this medicine? Side effects that you should report to your doctor or health care professional as soon as possible: -allergic reactions like skin rash, itching or hives, swelling of the face, lips, or tongue -dizziness -fever -pain, redness, or irritation at site where injected -pinpoint red spots on the skin -red or dark-brown urine -shortness of breath or breathing problems -stomach or side pain, or pain   at the shoulder -swelling -tiredness -trouble passing urine or change in the amount of urine Side effects that usually do not require medical attention (report to your doctor or health care professional if they continue or are  bothersome): -bone pain -muscle pain This list may not describe all possible side effects. Call your doctor for medical advice about side effects. You may report side effects to FDA at 1-800-FDA-1088. Where should I keep my medicine? Keep out of the reach of children. Store pre-filled syringes in a refrigerator between 2 and 8 degrees C (36 and 46 degrees F). Do not freeze. Keep in carton to protect from light. Throw away this medicine if it is left out of the refrigerator for more than 48 hours. Throw away any unused medicine after the expiration date. NOTE: This sheet is a summary. It may not cover all possible information. If you have questions about this medicine, talk to your doctor, pharmacist, or health care provider.    2016, Elsevier/Gold Standard. (2014-04-14 14:30:14)  

## 2016-02-27 ENCOUNTER — Other Ambulatory Visit: Payer: Self-pay | Admitting: *Deleted

## 2016-02-27 ENCOUNTER — Telehealth: Payer: Self-pay | Admitting: *Deleted

## 2016-02-27 MED ORDER — OXYCODONE HCL 5 MG PO TABS: 5.0000 mg | ORAL_TABLET | ORAL | 0 refills | Status: DC | PRN

## 2016-02-27 NOTE — Telephone Encounter (Signed)
Spoke with patient, script for oxycodone left at front for patient p/u.

## 2016-02-27 NOTE — Telephone Encounter (Signed)
Patient calling to c/o increased pain in left groin,side and hip area, not relieved with the hydrocodone, states he needs some relief. Is there something stronger that could help? Hydrocodone was prescribed by his urologist.

## 2016-02-27 NOTE — Telephone Encounter (Signed)
He can have Oxycodone 5 every 4 hrs. PRN. #30.

## 2016-03-12 ENCOUNTER — Telehealth: Payer: Self-pay | Admitting: *Deleted

## 2016-03-12 ENCOUNTER — Ambulatory Visit (HOSPITAL_BASED_OUTPATIENT_CLINIC_OR_DEPARTMENT_OTHER): Payer: Medicare Other

## 2016-03-12 ENCOUNTER — Telehealth: Payer: Self-pay | Admitting: Oncology

## 2016-03-12 ENCOUNTER — Other Ambulatory Visit (HOSPITAL_BASED_OUTPATIENT_CLINIC_OR_DEPARTMENT_OTHER): Payer: Medicare Other

## 2016-03-12 ENCOUNTER — Ambulatory Visit (HOSPITAL_BASED_OUTPATIENT_CLINIC_OR_DEPARTMENT_OTHER): Payer: Medicare Other | Admitting: Oncology

## 2016-03-12 VITALS — BP 104/92 | HR 90 | Temp 97.6°F | Resp 18 | Ht 71.0 in | Wt 132.6 lb

## 2016-03-12 DIAGNOSIS — C7951 Secondary malignant neoplasm of bone: Secondary | ICD-10-CM

## 2016-03-12 DIAGNOSIS — B20 Human immunodeficiency virus [HIV] disease: Secondary | ICD-10-CM

## 2016-03-12 DIAGNOSIS — Z5111 Encounter for antineoplastic chemotherapy: Secondary | ICD-10-CM

## 2016-03-12 DIAGNOSIS — C61 Malignant neoplasm of prostate: Secondary | ICD-10-CM

## 2016-03-12 DIAGNOSIS — M25559 Pain in unspecified hip: Secondary | ICD-10-CM

## 2016-03-12 LAB — COMPREHENSIVE METABOLIC PANEL
ALT: 168 U/L — AB (ref 0–55)
AST: 85 U/L — AB (ref 5–34)
Albumin: 3.4 g/dL — ABNORMAL LOW (ref 3.5–5.0)
Alkaline Phosphatase: 129 U/L (ref 40–150)
Anion Gap: 9 mEq/L (ref 3–11)
BUN: 21.8 mg/dL (ref 7.0–26.0)
CHLORIDE: 107 meq/L (ref 98–109)
CO2: 26 meq/L (ref 22–29)
CREATININE: 0.9 mg/dL (ref 0.7–1.3)
Calcium: 10.1 mg/dL (ref 8.4–10.4)
EGFR: >90 mL/min/{1.73_m2} (ref >90)
Glucose: 96 mg/dl (ref 70–140)
Potassium: 4.1 mEq/L (ref 3.5–5.1)
Sodium: 142 mEq/L (ref 136–145)
Total Bilirubin: 0.41 mg/dL (ref 0.20–1.20)
Total Protein: 6.9 g/dL (ref 6.4–8.3)

## 2016-03-12 LAB — CBC WITH DIFFERENTIAL/PLATELET
BASO%: 1 % (ref 0.0–2.0)
Basophils Absolute: 0.1 10*3/uL (ref 0.0–0.1)
EOS%: 0.1 % (ref 0.0–7.0)
Eosinophils Absolute: 0 10*3/uL (ref 0.0–0.5)
HEMATOCRIT: 39.6 % (ref 38.4–49.9)
HGB: 13.3 g/dL (ref 13.0–17.1)
LYMPH#: 2.1 10*3/uL (ref 0.9–3.3)
LYMPH%: 26.4 % (ref 14.0–49.0)
MCH: 33.3 pg (ref 27.2–33.4)
MCHC: 33.6 g/dL (ref 32.0–36.0)
MCV: 99.2 fL — ABNORMAL HIGH (ref 79.3–98.0)
MONO#: 0.9 10*3/uL (ref 0.1–0.9)
MONO%: 11.6 % (ref 0.0–14.0)
NEUT#: 4.8 10*3/uL (ref 1.5–6.5)
NEUT%: 60.9 % (ref 39.0–75.0)
Platelets: 352 10*3/uL (ref 140–400)
RBC: 3.99 10*6/uL — AB (ref 4.20–5.82)
RDW: 16.2 % — ABNORMAL HIGH (ref 11.0–14.6)
WBC: 7.9 10*3/uL (ref 4.0–10.3)

## 2016-03-12 MED ORDER — DEXAMETHASONE SODIUM PHOSPHATE 10 MG/ML IJ SOLN
INTRAMUSCULAR | Status: AC
  Filled 2016-03-12: qty 1

## 2016-03-12 MED ORDER — SODIUM CHLORIDE 0.9% FLUSH
10.0000 mL | INTRAVENOUS | Status: DC | PRN
  Administered 2016-03-12: 10 mL
  Filled 2016-03-12: qty 10

## 2016-03-12 MED ORDER — SODIUM CHLORIDE 0.9 % IV SOLN
75.0000 mg/m2 | Freq: Once | INTRAVENOUS | Status: AC
  Administered 2016-03-12: 130 mg via INTRAVENOUS
  Filled 2016-03-12: qty 13

## 2016-03-12 MED ORDER — DEXAMETHASONE SODIUM PHOSPHATE 10 MG/ML IJ SOLN
10.0000 mg | Freq: Once | INTRAMUSCULAR | Status: AC
  Administered 2016-03-12: 10 mg via INTRAVENOUS

## 2016-03-12 MED ORDER — HEPARIN SOD (PORK) LOCK FLUSH 100 UNIT/ML IV SOLN
500.0000 [IU] | Freq: Once | INTRAVENOUS | Status: AC | PRN
  Administered 2016-03-12: 500 [IU]
  Filled 2016-03-12: qty 5

## 2016-03-12 MED ORDER — SODIUM CHLORIDE 0.9 % IV SOLN
Freq: Once | INTRAVENOUS | Status: AC
  Administered 2016-03-12: 09:00:00 via INTRAVENOUS

## 2016-03-12 NOTE — Progress Notes (Signed)
Dr. Alen Blew okay to tx pt today with AST 85 and ALT 168.

## 2016-03-12 NOTE — Telephone Encounter (Signed)
Message sent to chemo scheduler to be added per 03/12/16 los. Appointments scheduled per, 03/12/16 los. A copy of the AVS report and appointment schedule was given to the patient, per 03/12/16 los.

## 2016-03-12 NOTE — Telephone Encounter (Signed)
Per LOS I have scheduled appt and notified the scheduler 

## 2016-03-12 NOTE — Progress Notes (Signed)
Hematology and Oncology Follow Up Visit  Dwayne Boone [id number] Nov 26, 1955 60 y.o. 03/12/2016 8:17 AM No PCP Per PatientNo ref. provider found   Principle Diagnosis: 60 year old with prostate cancer diagnosed in 2017. He presented with a PSA of 451 and a Gleason score 4+5 = 9. He has high volume disease with all 12 cores of his prostate involved with high-grade cancer. His staging workup showed metastatic disease including retroperitoneal adenopathy as well as diffuse bony metastasis   Prior Therapy: He underwent a prostate biopsy on 12/01/2015. The biopsy showed prostate adenocarcinoma with high-volume disease in all 12 cores. His Gleason score was 4+5 = 9 and the majority of the cores with few cores that had 4+4 = 8.  Current therapy:  Androgen deprivation therapy under the care of Dr.Budzyn at [facility]. Taxotere chemotherapy at 75 mg/m started on 01/30/2016. Chemotherapy will be given every 3 weeks for a total of 6 treatments. He is here for cycle 3.  Interim History: Dwayne Boone presents today for a follow-up visit. Since the last visit, he reports no major changes in his health. He continues to have left-sided groin pain associated with mobility. He denied any falls, pathological fractures or any bone pain. He does take ibuprofen which helps his symptoms. He denied any neurological deficits.   He received chemotherapy and tolerated it well so far. He denied any nausea, vomiting or dyspepsia. He denied any excessive fatigue or tiredness. He denied any peripheral neuropathy. He denied any infusion-related complications. He continues to attend to her activities of daily living including driving. He continues to be reasonably well although he lost a few pounds.  He denied any headaches, blurry vision, syncope or seizures. He does not report any fevers, chills or sweats. He does not report any chest pain, palpitation, orthopnea or leg edema. He does not report any cough, wheezing or  hemoptysis. He does not report any nausea, vomiting, abdominal pain but does report occasional constipation. He does not report any hematuria or dysuria. He does not report any skeletal complaints of arthralgias or myalgias. Remaining review of systems unremarkable.   Medications: I have reviewed the patient's current medications.  Current Outpatient Prescriptions  Medication Sig Dispense Refill  . dolutegravir (TIVICAY) 50 MG tablet Take 1 tablet (50 mg total) by mouth daily. 30 tablet 11  . emtricitabine-rilpivir-tenofovir AF (ODEFSEY) 200-25-25 MG TABS tablet Take 1 tablet by mouth daily with breakfast. 30 tablet 11  . finasteride (PROSCAR) 5 MG tablet Take 5 mg by mouth daily.  11  . lidocaine-prilocaine (EMLA) cream Apply 1 application topically as needed. 30 g 0  . oxyCODONE (OXY IR/ROXICODONE) 5 MG immediate release tablet Take 1 tablet (5 mg total) by mouth every 4 (four) hours as needed for severe pain. 30 tablet 0  . pravastatin (PRAVACHOL) 40 MG tablet Take 1 tablet (40 mg total) by mouth daily. 30 tablet 11  . prochlorperazine (COMPAZINE) 10 MG tablet Take 1 tablet (10 mg total) by mouth every 6 (six) hours as needed for nausea or vomiting. 30 tablet 0  . tamsulosin (FLOMAX) 0.4 MG CAPS capsule Take 1 capsule (0.4 mg total) by mouth daily after breakfast. 30 capsule 11   No current facility-administered medications for this visit.      Allergies: No Known Allergies  Past Medical History, Surgical history, Social history, and Family History were reviewed and updated.   Physical Exam: Blood pressure (!) 104/92, pulse 90, temperature 97.6 F (36.4 C), temperature source Oral, resp. rate 18,  height 5\' 11"  (1.803 m), weight 132 lb 9.6 oz (60.1 kg), SpO2 100 %. ECOG: 0 General appearance: Well-appearing gentleman appeared without distress. Head: Normocephalic, without obvious abnormality no oral thrush noted. Neck: no adenopathy Lymph nodes: Cervical, supraclavicular, and  axillary nodes normal. Heart:regular rate and rhythm, S1, S2 normal, no murmur, click, rub or gallop Lung:chest clear, no wheezing, rales, normal symmetric air entry Abdomin: soft, non-tender, without masses or organomegaly no shifting dullness or ascites. EXT:no erythema, induration, or nodules.  He had good range of motion in his left hip. No point tenderness. No erythema or induration noted around his left inguinal canal. No hernia palpated. Neurological examination: He had no deficits. He was able to ambulate without difficulties.  Lab Results: Lab Results  Component Value Date   WBC 7.9 03/12/2016   HGB 13.3 03/12/2016   HCT 39.6 03/12/2016   MCV 99.2 (H) 03/12/2016   PLT 352 03/12/2016     Chemistry      Component Value Date/Time   NA 143 02/20/2016 0748   K 4.5 02/20/2016 0748   CL 107 01/25/2016 0810   CO2 24 02/20/2016 0748   BUN 19.6 02/20/2016 0748   CREATININE 0.9 02/20/2016 0748      Component Value Date/Time   CALCIUM 10.0 02/20/2016 0748   ALKPHOS 125 02/20/2016 0748   AST 16 02/20/2016 0748   ALT 19 02/20/2016 0748   BILITOT 0.25 02/20/2016 0748     Results for Dwayne Boone (MRN [id number]) as of 03/12/2016 08:01  Ref. Range 01/30/2016 09:34 02/20/2016 07:48  PSA Latest Ref Range: 0.0 - 4.0 ng/mL 39.7 (H) 17.3 (H)     Impression and Plan:  60 year old gentleman with the following issues:  1. Prostate cancer diagnosed in August 2017. He presented with a PSA of 451 and a Gleason score 4+5 = 9. He has high volume disease with all 12 cores of his prostate involved with high-grade cancer. His staging workup showed metastatic disease including retroperitoneal adenopathy as well as diffuse bony metastasis. He has been started on androgen deprivation with Lupron.  He is currently receiving Taxotere chemotherapy and tolerated The last cycle. The plan is to proceed with cycle 3 without any dose reduction or delay. The plan is to give him a total of 6 cycles. His  PSA continues to drop nicely down to 1739.  2. IV access: Port-A-Cath inserted without complications. This will be utilized for chemotherapy moving forward.  3. Androgen deprivation: He continues to receive Lupron and will continue indefinitely.  4. Bone directed therapy: He has not received Xgeva at this time awaiting dental clearance. We'll await start of Xgeva until he is fully healed from a dental standpoint.  5. Antiemetics: Prescription for Compazine available to the patient. He did not experience any nausea or vomiting .  7. HIV: He is followed by Dr.Van Dam . His regimen has been adjusted to accommodate Taxotere chemotherapy.  8. Hip pain: Unclear etiology but could be related to metastatic prostate cancer. Continues to be an issue at this time and remains unclear to me whether this related to his malignancy or not. I will obtain an MRI of the pelvis to rule out metastatic bone lesions. If it's cancer related, radiation therapy can be utilized in this setting to relieve his pain. His pain appears to be adequately controlled with her current pain medication and utilizing nonsteroidal anti-inflammatories.  9. Follow-up: Will be in 3 weeks for the next cycle of chemotherapy.  [id number], MD 12/5/20178:17 AM

## 2016-03-12 NOTE — Patient Instructions (Signed)
[facility] Discharge Instructions for Patients Receiving Chemotherapy  Today you received the following chemotherapy agents: Taxotere.  To help prevent nausea and vomiting after your treatment, we encourage you to take your nausea medication: Compazine 10 mg every 6 hours as needed for nausea.   If you develop nausea and vomiting that is not controlled by your nausea medication, call the clinic.   BELOW ARE SYMPTOMS THAT SHOULD BE REPORTED IMMEDIATELY:  *FEVER GREATER THAN 100.5 F  *CHILLS WITH OR WITHOUT FEVER  NAUSEA AND VOMITING THAT IS NOT CONTROLLED WITH YOUR NAUSEA MEDICATION  *UNUSUAL SHORTNESS OF BREATH  *UNUSUAL BRUISING OR BLEEDING  TENDERNESS IN MOUTH AND THROAT WITH OR WITHOUT PRESENCE OF ULCERS  *URINARY PROBLEMS  *BOWEL PROBLEMS  UNUSUAL RASH Items with * indicate a potential emergency and should be followed up as soon as possible.  Feel free to call the clinic you have any questions or concerns. The clinic phone number is [phone].  Please show the [place] at check-in to the Emergency Department and triage nurse.

## 2016-03-13 LAB — PSA: Prostate Specific Ag, Serum: 9.8 ng/mL — ABNORMAL HIGH (ref 0.0–4.0)

## 2016-03-14 ENCOUNTER — Ambulatory Visit (HOSPITAL_BASED_OUTPATIENT_CLINIC_OR_DEPARTMENT_OTHER): Payer: Medicare Other

## 2016-03-14 VITALS — BP 127/80 | HR 96 | Temp 98.2°F | Resp 20

## 2016-03-14 DIAGNOSIS — C7951 Secondary malignant neoplasm of bone: Secondary | ICD-10-CM | POA: Diagnosis not present

## 2016-03-14 DIAGNOSIS — C61 Malignant neoplasm of prostate: Secondary | ICD-10-CM

## 2016-03-14 MED ORDER — PEGFILGRASTIM INJECTION 6 MG/0.6ML ~~LOC~~
6.0000 mg | PREFILLED_SYRINGE | Freq: Once | SUBCUTANEOUS | Status: AC
  Administered 2016-03-14: 6 mg via SUBCUTANEOUS
  Filled 2016-03-14: qty 0.6

## 2016-03-14 NOTE — Patient Instructions (Signed)
Pegfilgrastim injection What is this medicine? PEGFILGRASTIM (PEG fil gra stim) is a long-acting granulocyte colony-stimulating factor that stimulates the growth of neutrophils, a type of white blood cell important in the body's fight against infection. It is used to reduce the incidence of fever and infection in patients with certain types of cancer who are receiving chemotherapy that affects the bone marrow, and to increase survival after being exposed to high doses of radiation. This medicine may be used for other purposes; ask your health care provider or pharmacist if you have questions. COMMON BRAND NAME(S): Neulasta What should I tell my health care provider before I take this medicine? They need to know if you have any of these conditions: -kidney disease -latex allergy -ongoing radiation therapy -sickle cell disease -skin reactions to acrylic adhesives (On-Body Injector only) -an unusual or allergic reaction to pegfilgrastim, filgrastim, other medicines, foods, dyes, or preservatives -pregnant or trying to get pregnant -breast-feeding How should I use this medicine? This medicine is for injection under the skin. If you get this medicine at home, you will be taught how to prepare and give the pre-filled syringe or how to use the On-body Injector. Refer to the patient Instructions for Use for detailed instructions. Use exactly as directed. Take your medicine at regular intervals. Do not take your medicine more often than directed. It is important that you put your used needles and syringes in a special sharps container. Do not put them in a trash can. If you do not have a sharps container, call your pharmacist or healthcare provider to get one. Talk to your pediatrician regarding the use of this medicine in children. While this drug may be prescribed for selected conditions, precautions do apply. Overdosage: If you think you have taken too much of this medicine contact a poison control  center or emergency room at once. NOTE: This medicine is only for you. Do not share this medicine with others. What if I miss a dose? It is important not to miss your dose. Call your doctor or health care professional if you miss your dose. If you miss a dose due to an On-body Injector failure or leakage, a new dose should be administered as soon as possible using a single prefilled syringe for manual use. What may interact with this medicine? Interactions have not been studied. Give your health care provider a list of all the medicines, herbs, non-prescription drugs, or dietary supplements you use. Also tell them if you smoke, drink alcohol, or use illegal drugs. Some items may interact with your medicine. This list may not describe all possible interactions. Give your health care provider a list of all the medicines, herbs, non-prescription drugs, or dietary supplements you use. Also tell them if you smoke, drink alcohol, or use illegal drugs. Some items may interact with your medicine. What should I watch for while using this medicine? You may need blood work done while you are taking this medicine. If you are going to need a MRI, CT scan, or other procedure, tell your doctor that you are using this medicine (On-Body Injector only). What side effects may I notice from receiving this medicine? Side effects that you should report to your doctor or health care professional as soon as possible: -allergic reactions like skin rash, itching or hives, swelling of the face, lips, or tongue -dizziness -fever -pain, redness, or irritation at site where injected -pinpoint red spots on the skin -red or dark-brown urine -shortness of breath or breathing problems -stomach or   side pain, or pain at the shoulder -swelling -tiredness -trouble passing urine or change in the amount of urine Side effects that usually do not require medical attention (report to your doctor or health care professional if they  continue or are bothersome): -bone pain -muscle pain This list may not describe all possible side effects. Call your doctor for medical advice about side effects. You may report side effects to FDA at 1-800-FDA-1088. Where should I keep my medicine? Keep out of the reach of children. Store pre-filled syringes in a refrigerator between 2 and 8 degrees C (36 and 46 degrees F). Do not freeze. Keep in carton to protect from light. Throw away this medicine if it is left out of the refrigerator for more than 48 hours. Throw away any unused medicine after the expiration date. NOTE: This sheet is a summary. It may not cover all possible information. If you have questions about this medicine, talk to your doctor, pharmacist, or health care provider.  2017 Elsevier/Gold Standard (2014-04-14 14:30:14)  

## 2016-03-27 ENCOUNTER — Ambulatory Visit (HOSPITAL_COMMUNITY)
Admission: RE | Admit: 2016-03-27 | Discharge: 2016-03-27 | Disposition: A | Discharge status: Home / Self Care | Payer: Medicare Other | Source: Ambulatory Visit | Attending: Oncology | Admitting: Oncology

## 2016-03-27 DIAGNOSIS — R1900 Intra-abdominal and pelvic swelling, mass and lump, unspecified site: Secondary | ICD-10-CM | POA: Diagnosis not present

## 2016-03-27 DIAGNOSIS — C61 Malignant neoplasm of prostate: Secondary | ICD-10-CM | POA: Diagnosis not present

## 2016-03-27 DIAGNOSIS — M899 Disorder of bone, unspecified: Secondary | ICD-10-CM | POA: Insufficient documentation

## 2016-03-27 DIAGNOSIS — R1909 Other intra-abdominal and pelvic swelling, mass and lump: Secondary | ICD-10-CM | POA: Diagnosis not present

## 2016-03-27 MED ORDER — GADOBENATE DIMEGLUMINE 529 MG/ML IV SOLN
15.0000 mL | Freq: Once | INTRAVENOUS | Status: AC | PRN
  Administered 2016-03-27: 12 mL via INTRAVENOUS

## 2016-03-29 ENCOUNTER — Other Ambulatory Visit: Payer: Self-pay | Admitting: Oncology

## 2016-03-29 ENCOUNTER — Telehealth: Payer: Self-pay | Admitting: *Deleted

## 2016-03-29 ENCOUNTER — Other Ambulatory Visit: Payer: Self-pay | Admitting: *Deleted

## 2016-03-29 ENCOUNTER — Encounter: Payer: Self-pay | Admitting: Radiation Oncology

## 2016-03-29 DIAGNOSIS — C61 Malignant neoplasm of prostate: Secondary | ICD-10-CM

## 2016-03-29 MED ORDER — OXYCODONE HCL 5 MG PO TABS: 5.0000 mg | ORAL_TABLET | ORAL | 0 refills | Status: DC | PRN

## 2016-03-29 NOTE — Telephone Encounter (Signed)
"  I had an MRI Wednesday.  I would like the results and would like to know why I'm still in pain in the left hip.  Pain started three months ago.  I take Oxycodone 5 mg and ibuprofen.  The ibuprofen helps the oxycodone work better.  I have four pills left and need refill today.  What am I to do next?  Return number [phone]."  Next scheduled F/U on 04-02-2016.

## 2016-03-29 NOTE — Telephone Encounter (Signed)
I will discuss the results on 12/26. OK to refill his Oxycodone.  He will be referred to radiation oncology oncology.

## 2016-03-29 NOTE — Telephone Encounter (Signed)
This RN spoke with patient and told him his prescription is ready for pick up. Per Dr. Alen Blew, he will review review scan results on 12/26 at his MD appointment. Patient verbalized understanding.

## 2016-04-02 ENCOUNTER — Ambulatory Visit (HOSPITAL_BASED_OUTPATIENT_CLINIC_OR_DEPARTMENT_OTHER): Payer: Medicare Other | Admitting: Oncology

## 2016-04-02 ENCOUNTER — Other Ambulatory Visit (HOSPITAL_BASED_OUTPATIENT_CLINIC_OR_DEPARTMENT_OTHER): Payer: Medicare Other

## 2016-04-02 ENCOUNTER — Ambulatory Visit (HOSPITAL_BASED_OUTPATIENT_CLINIC_OR_DEPARTMENT_OTHER): Payer: Medicare Other

## 2016-04-02 ENCOUNTER — Encounter: Payer: Self-pay | Admitting: Medical Oncology

## 2016-04-02 ENCOUNTER — Telehealth: Payer: Self-pay | Admitting: Oncology

## 2016-04-02 VITALS — BP 123/83 | HR 85 | Temp 98.1°F | Resp 20

## 2016-04-02 VITALS — BP 135/88 | HR 100 | Temp 97.7°F | Resp 19 | Wt 133.3 lb

## 2016-04-02 DIAGNOSIS — C61 Malignant neoplasm of prostate: Secondary | ICD-10-CM | POA: Diagnosis not present

## 2016-04-02 DIAGNOSIS — B20 Human immunodeficiency virus [HIV] disease: Secondary | ICD-10-CM | POA: Diagnosis not present

## 2016-04-02 DIAGNOSIS — Z5111 Encounter for antineoplastic chemotherapy: Secondary | ICD-10-CM

## 2016-04-02 DIAGNOSIS — C7951 Secondary malignant neoplasm of bone: Secondary | ICD-10-CM

## 2016-04-02 LAB — COMPREHENSIVE METABOLIC PANEL
ALBUMIN: 3.8 g/dL (ref 3.5–5.0)
ALK PHOS: 133 U/L (ref 40–150)
ALT: 21 U/L (ref 0–55)
AST: 20 U/L (ref 5–34)
Anion Gap: 9 mEq/L (ref 3–11)
BUN: 20.9 mg/dL (ref 7.0–26.0)
CO2: 24 mEq/L (ref 22–29)
CREATININE: 0.9 mg/dL (ref 0.7–1.3)
Calcium: 10.6 mg/dL — ABNORMAL HIGH (ref 8.4–10.4)
Chloride: 110 mEq/L — ABNORMAL HIGH (ref 98–109)
EGFR: >90 mL/min/{1.73_m2} (ref >90)
GLUCOSE: 95 mg/dL (ref 70–140)
POTASSIUM: 4 meq/L (ref 3.5–5.1)
SODIUM: 143 meq/L (ref 136–145)
TOTAL PROTEIN: 7.1 g/dL (ref 6.4–8.3)
Total Bilirubin: 0.34 mg/dL (ref 0.20–1.20)

## 2016-04-02 LAB — CBC WITH DIFFERENTIAL/PLATELET
BASO%: 0.2 % (ref 0.0–2.0)
Basophils Absolute: 0 10*3/uL (ref 0.0–0.1)
EOS%: 0.2 % (ref 0.0–7.0)
Eosinophils Absolute: 0 10*3/uL (ref 0.0–0.5)
HCT: 38.8 % (ref 38.4–49.9)
HEMOGLOBIN: 12.9 g/dL — AB (ref 13.0–17.1)
LYMPH#: 2.7 10*3/uL (ref 0.9–3.3)
LYMPH%: 31.6 % (ref 14.0–49.0)
MCH: 32.6 pg (ref 27.2–33.4)
MCHC: 33.2 g/dL (ref 32.0–36.0)
MCV: 98 fL (ref 79.3–98.0)
MONO#: 1.2 10*3/uL — ABNORMAL HIGH (ref 0.1–0.9)
MONO%: 14.6 % — AB (ref 0.0–14.0)
NEUT%: 53.4 % (ref 39.0–75.0)
NEUTROS ABS: 4.5 10*3/uL (ref 1.5–6.5)
Platelets: 319 10*3/uL (ref 140–400)
RBC: 3.96 10*6/uL — ABNORMAL LOW (ref 4.20–5.82)
RDW: 16.9 % — AB (ref 11.0–14.6)
WBC: 8.5 10*3/uL (ref 4.0–10.3)

## 2016-04-02 MED ORDER — DEXAMETHASONE SODIUM PHOSPHATE 10 MG/ML IJ SOLN
INTRAMUSCULAR | Status: AC
  Filled 2016-04-02: qty 1

## 2016-04-02 MED ORDER — SODIUM CHLORIDE 0.9 % IV SOLN
Freq: Once | INTRAVENOUS | Status: AC
Start: 2016-04-02 — End: 2016-04-02
  Administered 2016-04-02: 10:00:00 via INTRAVENOUS

## 2016-04-02 MED ORDER — HEPARIN SOD (PORK) LOCK FLUSH 100 UNIT/ML IV SOLN
500.0000 [IU] | Freq: Once | INTRAVENOUS | Status: AC | PRN
  Administered 2016-04-02: 500 [IU]
  Filled 2016-04-02: qty 5

## 2016-04-02 MED ORDER — SODIUM CHLORIDE 0.9% FLUSH
10.0000 mL | INTRAVENOUS | Status: DC | PRN
  Administered 2016-04-02: 10 mL
  Filled 2016-04-02: qty 10

## 2016-04-02 MED ORDER — DEXAMETHASONE SODIUM PHOSPHATE 10 MG/ML IJ SOLN
10.0000 mg | Freq: Once | INTRAMUSCULAR | Status: AC
  Administered 2016-04-02: 10 mg via INTRAVENOUS

## 2016-04-02 MED ORDER — SODIUM CHLORIDE 0.9 % IV SOLN
75.0000 mg/m2 | Freq: Once | INTRAVENOUS | Status: AC
  Administered 2016-04-02: 130 mg via INTRAVENOUS
  Filled 2016-04-02: qty 13

## 2016-04-02 NOTE — Telephone Encounter (Signed)
Per 04/02/16 los, patient was scheduled for Chemo on 05/14/16 & an injection on 05/16/16. Follow up appointment scheduled with Dr Alen Blew for 05/16/16, per 04/02/16 los. Patient was given a copy of the appointment schedule and AVS report, per 04/02/16 los.

## 2016-04-02 NOTE — Patient Instructions (Signed)
[facility] Discharge Instructions for Patients Receiving Chemotherapy  Today you received the following chemotherapy agents: Taxotere.  To help prevent nausea and vomiting after your treatment, we encourage you to take your nausea medication: Compazine 10 mg every 6 hours as needed for nausea.   If you develop nausea and vomiting that is not controlled by your nausea medication, call the clinic.   BELOW ARE SYMPTOMS THAT SHOULD BE REPORTED IMMEDIATELY:  *FEVER GREATER THAN 100.5 F  *CHILLS WITH OR WITHOUT FEVER  NAUSEA AND VOMITING THAT IS NOT CONTROLLED WITH YOUR NAUSEA MEDICATION  *UNUSUAL SHORTNESS OF BREATH  *UNUSUAL BRUISING OR BLEEDING  TENDERNESS IN MOUTH AND THROAT WITH OR WITHOUT PRESENCE OF ULCERS  *URINARY PROBLEMS  *BOWEL PROBLEMS  UNUSUAL RASH Items with * indicate a potential emergency and should be followed up as soon as possible.  Feel free to call the clinic you have any questions or concerns. The clinic phone number is [phone].  Please show the [facility] at check-in to the Emergency Department and triage nurse.

## 2016-04-02 NOTE — Progress Notes (Signed)
Hematology and Oncology Follow Up Visit  KONOR Boone [id number] [birth year] 60 y.o. 04/02/2016 8:33 AM No PCP Per PatientNo ref. provider found   Principle Diagnosis: 60 year old with prostate cancer diagnosed in 2017. He presented with a PSA of 451 and a Gleason score 4+5 = 9. He has high volume disease with all 12 cores of his prostate involved with high-grade cancer. His staging workup showed metastatic disease including retroperitoneal adenopathy as well as diffuse bony metastasis   Prior Therapy: He underwent a prostate biopsy on 12/01/2015. The biopsy showed prostate adenocarcinoma with high-volume disease in all 12 cores. His Gleason score was 4+5 = 9 and the majority of the cores with few cores that had 4+4 = 8.  Current therapy:  Androgen deprivation therapy under the care of Dr.Budzyn at [facility]. Taxotere chemotherapy at 75 mg/m started on 01/30/2016. Chemotherapy will be given every 3 weeks for a total of 6 treatments. He is here for cycle 4.   Interim History: [name] presents today for a follow-up visit. Since the last visit, he continues to report hip pain which has not dramatically changed. He has been using oxycodone for pain all the feels that ibuprofen is working better. His pain is predominantly with mobility although he does report some soreness on the left side at rest. He denied any neurological deficits. He denied any neuropathy, weakness or inability to ambulate.  He received the last cycle of chemotherapy and tolerated it well so far. He denied any nausea, vomiting or dyspepsia. He denied any excessive fatigue or tiredness. He denied any peripheral neuropathy. He denied any infusion-related complications. He continues to attend to her activities of daily living including driving. His appetite remains reasonable and I will not lost any weight.  He denied any headaches, blurry vision, syncope or seizures. He does not report any fevers, chills or sweats. He does  not report any chest pain, palpitation, orthopnea or leg edema. He does not report any cough, wheezing or hemoptysis. He does not report any nausea, vomiting, abdominal pain but does report occasional constipation. He does not report any hematuria or dysuria. He does not report any skeletal complaints of arthralgias or myalgias. Remaining review of systems unremarkable.   Medications: I have reviewed the patient's current medications.  Current Outpatient Prescriptions  Medication Sig Dispense Refill  . dolutegravir (TIVICAY) 50 MG tablet Take 1 tablet (50 mg total) by mouth daily. 30 tablet 11  . emtricitabine-rilpivir-tenofovir AF (ODEFSEY) 200-25-25 MG TABS tablet Take 1 tablet by mouth daily with breakfast. 30 tablet 11  . finasteride (PROSCAR) 5 MG tablet Take 5 mg by mouth daily.  11  . lidocaine-prilocaine (EMLA) cream Apply 1 application topically as needed. 30 g 0  . oxyCODONE (OXY IR/ROXICODONE) 5 MG immediate release tablet Take 1 tablet (5 mg total) by mouth every 4 (four) hours as needed for severe pain. 45 tablet 0  . pravastatin (PRAVACHOL) 40 MG tablet Take 1 tablet (40 mg total) by mouth daily. 30 tablet 11  . prochlorperazine (COMPAZINE) 10 MG tablet Take 1 tablet (10 mg total) by mouth every 6 (six) hours as needed for nausea or vomiting. 30 tablet 0  . tamsulosin (FLOMAX) 0.4 MG CAPS capsule Take 1 capsule (0.4 mg total) by mouth daily after breakfast. 30 capsule 11   No current facility-administered medications for this visit.      Allergies: No Known Allergies  Past Medical History, Surgical history, Social history, and Family History were reviewed and updated.  Physical Exam: Blood pressure 135/88, pulse 100, temperature 97.7 F (36.5 C), temperature source Oral, resp. rate 19, weight 133 lb 4.8 oz (60.5 kg), SpO2 100 %. ECOG: 0 General appearance: Alert, awake gentleman without distress. Head: Normocephalic, without obvious abnormality no oral thrush  noted. Neck: no adenopathy Lymph nodes: Cervical, supraclavicular, and axillary nodes normal. Heart:regular rate and rhythm, S1, S2 normal, no murmur, click, rub or gallop Lung:chest clear, no wheezing, rales, normal symmetric air entry Abdomin: soft, non-tender, without masses or organomegaly no shifting dullness or ascites. EXT:no erythema, induration, or nodules.  He had good range of motion in his left hip. No point tenderness.  Neurological examination: He had no deficits. No motor, sensory deficits. He was able to ambulate without difficulties.  Lab Results: Lab Results  Component Value Date   WBC 8.5 04/02/2016   HGB 12.9 (L) 04/02/2016   HCT 38.8 04/02/2016   MCV 98.0 04/02/2016   PLT 319 04/02/2016     Chemistry      Component Value Date/Time   NA 142 03/12/2016 0748   K 4.1 03/12/2016 0748   CL 107 01/25/2016 0810   CO2 26 03/12/2016 0748   BUN 21.8 03/12/2016 0748   CREATININE 0.9 03/12/2016 0748      Component Value Date/Time   CALCIUM 10.1 03/12/2016 0748   ALKPHOS 129 03/12/2016 0748   AST 85 (H) 03/12/2016 0748   ALT 168 (H) 03/12/2016 0748   BILITOT 0.41 03/12/2016 0748      Results for BRYIAN, RICHMAN (MRN [id number]) as of 04/02/2016 08:06  Ref. Range 10/19/2015 14:57 01/30/2016 09:34 02/20/2016 07:48 03/12/2016 07:48  PSA Latest Ref Range: 0.0 - 4.0 ng/mL 441.90 (H) 39.7 (H) 17.3 (H) 9.8 (H)     1. 7.4 x 4.4 x 5.6 cm heterogeneously enhancing expansile mass involving the left pubic body and left superior pubic ramus which has significantly enlarged compared with 01/01/2016 consistent with metastatic malignancy. The mass severely narrows the obturator foramen and may result in obturator nerve impingement. 2. Incompletely characterized 2.2 x 4.7 cm mass involving the entire left side of the prostate gland most consistent with prostate cancer. 3. Multiple small sclerotic bone lesions in the pelvis and lower lumbosacral spine consistent with metastatic  prostate cancer.    Impression and Plan:  60 year old gentleman with the following issues:  1. Prostate cancer diagnosed in August 2017. He presented with a PSA of 451 and a Gleason score 4+5 = 9. He has high volume disease with all 12 cores of his prostate involved with high-grade cancer. His staging workup showed metastatic disease including retroperitoneal adenopathy as well as diffuse bony metastasis. He has been started on androgen deprivation with Lupron.  He is currently receiving Taxotere chemotherapy and continues to tolerated well. His PSA continues to respond down to 9.8. The plan is to continue with systemic chemotherapy and proceed with cycle 5 without any dose reduction or delay. The plan is to complete total 6 cycles.  2. IV access: Port-A-Cath is being utilized without complications.  3. Androgen deprivation: He continues to receive Lupron and will continue indefinitely.  4. Bone directed therapy: He has not received Xgeva at this time awaiting dental clearance. We'll await start of Xgeva until he is fully healed from a dental standpoint.  5. Antiemetics: Prescription for Compazine available to the patient. He did not experience any nausea or vomiting .  7. HIV: He is followed by Dr.Van Dam. No recent complications noted.  8. Hip pain: MRI  of the hip was reviewed and showed a large mass in the left pelvic area involving the left pubic body in the left pubic ramus. I have referred him to radiation oncology for palliative radiation therapy for pain management. I will also instructed him to alternate oxycodone and nonsteroidal  antiinflammatory disease for the time being.   9. Follow-up: Will be in 3 weeks for the next cycle of chemotherapy.      [facility], MD 12/26/20178:33 AM

## 2016-04-02 NOTE — Progress Notes (Signed)
Tolerating chemo therapy well. He states that his left hip has been hurting. He has been taking one oxycodone tablet which does not help that much but has gotten relief from ibuprofen. We discussed called Dr. Hazeline Junker nurse if he finds that pain medication is not working so we can increase the dose or prescribe a stronger medication. He voiced understanding.

## 2016-04-03 LAB — PSA: PROSTATE SPECIFIC AG, SERUM: 8.7 ng/mL — AB (ref 0.0–4.0)

## 2016-04-04 ENCOUNTER — Ambulatory Visit (HOSPITAL_BASED_OUTPATIENT_CLINIC_OR_DEPARTMENT_OTHER): Payer: Medicare Other

## 2016-04-04 VITALS — BP 126/61 | HR 100 | Temp 97.8°F | Resp 18

## 2016-04-04 DIAGNOSIS — Z5189 Encounter for other specified aftercare: Secondary | ICD-10-CM

## 2016-04-04 DIAGNOSIS — C7951 Secondary malignant neoplasm of bone: Secondary | ICD-10-CM | POA: Diagnosis not present

## 2016-04-04 DIAGNOSIS — C61 Malignant neoplasm of prostate: Secondary | ICD-10-CM

## 2016-04-04 MED ORDER — PEGFILGRASTIM INJECTION 6 MG/0.6ML ~~LOC~~
6.0000 mg | PREFILLED_SYRINGE | Freq: Once | SUBCUTANEOUS | Status: AC
  Administered 2016-04-04: 6 mg via SUBCUTANEOUS
  Filled 2016-04-04: qty 0.6

## 2016-04-04 NOTE — Patient Instructions (Signed)
Pegfilgrastim injection What is this medicine? PEGFILGRASTIM (PEG fil gra stim) is a long-acting granulocyte colony-stimulating factor that stimulates the growth of neutrophils, a type of white blood cell important in the body's fight against infection. It is used to reduce the incidence of fever and infection in patients with certain types of cancer who are receiving chemotherapy that affects the bone marrow, and to increase survival after being exposed to high doses of radiation. This medicine may be used for other purposes; ask your health care provider or pharmacist if you have questions. What should I tell my health care provider before I take this medicine? They need to know if you have any of these conditions: -kidney disease -latex allergy -ongoing radiation therapy -sickle cell disease -skin reactions to acrylic adhesives (On-Body Injector only) -an unusual or allergic reaction to pegfilgrastim, filgrastim, other medicines, foods, dyes, or preservatives -pregnant or trying to get pregnant -breast-feeding How should I use this medicine? This medicine is for injection under the skin. If you get this medicine at home, you will be taught how to prepare and give the pre-filled syringe or how to use the On-body Injector. Refer to the patient Instructions for Use for detailed instructions. Use exactly as directed. Take your medicine at regular intervals. Do not take your medicine more often than directed. It is important that you put your used needles and syringes in a special sharps container. Do not put them in a trash can. If you do not have a sharps container, call your pharmacist or healthcare provider to get one. Talk to your pediatrician regarding the use of this medicine in children. While this drug may be prescribed for selected conditions, precautions do apply. Overdosage: If you think you have taken too much of this medicine contact a poison control center or emergency room at  once. NOTE: This medicine is only for you. Do not share this medicine with others. What if I miss a dose? It is important not to miss your dose. Call your doctor or health care professional if you miss your dose. If you miss a dose due to an On-body Injector failure or leakage, a new dose should be administered as soon as possible using a single prefilled syringe for manual use. What may interact with this medicine? Interactions have not been studied. Give your health care provider a list of all the medicines, herbs, non-prescription drugs, or dietary supplements you use. Also tell them if you smoke, drink alcohol, or use illegal drugs. Some items may interact with your medicine. This list may not describe all possible interactions. Give your health care provider a list of all the medicines, herbs, non-prescription drugs, or dietary supplements you use. Also tell them if you smoke, drink alcohol, or use illegal drugs. Some items may interact with your medicine. What should I watch for while using this medicine? You may need blood work done while you are taking this medicine. If you are going to need a MRI, CT scan, or other procedure, tell your doctor that you are using this medicine (On-Body Injector only). What side effects may I notice from receiving this medicine? Side effects that you should report to your doctor or health care professional as soon as possible: -allergic reactions like skin rash, itching or hives, swelling of the face, lips, or tongue -dizziness -fever -pain, redness, or irritation at site where injected -pinpoint red spots on the skin -red or dark-brown urine -shortness of breath or breathing problems -stomach or side pain, or pain   at the shoulder -swelling -tiredness -trouble passing urine or change in the amount of urine Side effects that usually do not require medical attention (report to your doctor or health care professional if they continue or are  bothersome): -bone pain -muscle pain This list may not describe all possible side effects. Call your doctor for medical advice about side effects. You may report side effects to FDA at 1-800-FDA-1088. Where should I keep my medicine? Keep out of the reach of children. Store pre-filled syringes in a refrigerator between 2 and 8 degrees C (36 and 46 degrees F). Do not freeze. Keep in carton to protect from light. Throw away this medicine if it is left out of the refrigerator for more than 48 hours. Throw away any unused medicine after the expiration date. NOTE: This sheet is a summary. It may not cover all possible information. If you have questions about this medicine, talk to your doctor, pharmacist, or health care provider.    2016, Elsevier/Gold Standard. (2014-04-14 14:30:14)  

## 2016-04-09 NOTE — Progress Notes (Signed)
Histology and Location of Primary Cancer: High grade prostate adenocarcinoma. Presented with a PSA 451 and a Gleason of 4+5=9.   Sites of Visceral and Bony Metastatic Disease: retroperitoneal adenopathy as well as diffuse bony metastasis  Location(s) of Symptomatic Metastases: left hip  Past/Anticipated chemotherapy by medical oncology, if any:  Prior Therapy: He underwent a prostate biopsy on 12/01/2015. The biopsy showed prostate adenocarcinoma with high-volume disease in all 12 cores. His Gleason score was 4+5 = 9 and the majority of the cores with few cores that had 4+4 = 8.  Current therapy:  Androgen deprivation therapy under the care of Dr.Budzyn at [facility]. Last injection around September/October. Per patient Dwayne Boone planning follow up sometime this month; patient plans to call to obtain appointment Taxotere chemotherapy at 75 mg/m started on 01/30/2016. Chemotherapy will be given every 3 weeks for a total of 6 treatments. He is here for cycle 4.   Pain on a scale of 0-10 is:  Reports using oxycodone for pain although he feels ibuprofen works better. Reports pain is predominately with mobility although he does reports some soreness on the left side with rest.   If Spine Met(s), symptoms, if any, include:  Bowel/Bladder retention or incontinence (please describe): Reports occasional constipation. Denies bowel or bladder retention, leakage or incontinence.  Numbness or weakness in extremities (please describe): denies  Current Decadron regimen, if applicable: denies  Ambulatory status? Walker? Wheelchair?: ambulatory  SAFETY ISSUES:  Prior radiation? no  Pacemaker/ICD? no  Possible current pregnancy? no  Is the patient on methotrexate? no  Current Complaints / other details:  61 year old male. Single. Denies nausea or vomiting. Weight loss noted. Healthy weight 145 lb. Weight two weeks ago 133 lb. Present weight 129.6 lb  From Shadad's 04/02/16 note: MRI of the  hip was reviewed and showed a large mass in the left pelvic area involving the left pubic body in the left pubic ramus. I have referred him to radiation oncology for palliative radiation therapy for pain management. I will also instructed him to alternate oxycodone and nonsteroidal  antiinflammatory disease for the time being.

## 2016-04-10 ENCOUNTER — Ambulatory Visit
Admission: RE | Admit: 2016-04-10 | Discharge: 2016-04-10 | Disposition: A | Discharge status: Home / Self Care | Payer: Medicare Other | Source: Ambulatory Visit | Attending: Radiation Oncology | Admitting: Radiation Oncology

## 2016-04-10 ENCOUNTER — Encounter: Payer: Self-pay | Admitting: Radiation Oncology

## 2016-04-10 VITALS — BP 110/78 | HR 107 | Temp 97.8°F | Resp 16 | Ht 71.0 in | Wt 129.6 lb

## 2016-04-10 DIAGNOSIS — F17210 Nicotine dependence, cigarettes, uncomplicated: Secondary | ICD-10-CM | POA: Insufficient documentation

## 2016-04-10 DIAGNOSIS — C7952 Secondary malignant neoplasm of bone marrow: Principal | ICD-10-CM

## 2016-04-10 DIAGNOSIS — Z510 Encounter for antineoplastic radiation therapy: Secondary | ICD-10-CM | POA: Insufficient documentation

## 2016-04-10 DIAGNOSIS — C7951 Secondary malignant neoplasm of bone: Secondary | ICD-10-CM

## 2016-04-10 DIAGNOSIS — Z79899 Other long term (current) drug therapy: Secondary | ICD-10-CM | POA: Diagnosis not present

## 2016-04-10 DIAGNOSIS — G893 Neoplasm related pain (acute) (chronic): Secondary | ICD-10-CM | POA: Diagnosis not present

## 2016-04-10 DIAGNOSIS — C61 Malignant neoplasm of prostate: Secondary | ICD-10-CM | POA: Diagnosis not present

## 2016-04-10 DIAGNOSIS — B20 Human immunodeficiency virus [HIV] disease: Secondary | ICD-10-CM | POA: Insufficient documentation

## 2016-04-10 DIAGNOSIS — Z21 Asymptomatic human immunodeficiency virus [HIV] infection status: Secondary | ICD-10-CM | POA: Diagnosis not present

## 2016-04-10 HISTORY — DX: Malignant neoplasm of prostate: C61

## 2016-04-10 HISTORY — DX: Malignant neoplasm of bone and articular cartilage, unspecified: C41.9

## 2016-04-10 NOTE — Progress Notes (Signed)
See progress note under physician encounter. 

## 2016-04-10 NOTE — Progress Notes (Signed)
  Radiation Oncology         [phone] ________________________________  Name: Dwayne Boone MRN: [id number]  Date: 04/10/2016  DOB: 1955/11/19  SIMULATION AND TREATMENT PLANNING NOTE    ICD-9-CM ICD-10-CM   1. Bone metastasis (HCC) 198.5 C79.51     DIAGNOSIS: 61 y.o. gentleman with metastatic adenocarcinoma of the prostate with a Gleason's score of 4+5=9, a PSA of 441.90, and painful left hip metastasis  NARRATIVE:  The patient was brought to the [facility].  Identity was confirmed.  All relevant records and images related to the planned course of therapy were reviewed.  The patient freely provided informed written consent to proceed with treatment after reviewing the details related to the planned course of therapy. The consent form was witnessed and verified by the simulation staff.  Then, the patient was set-up in a stable reproducible  supine position for radiation therapy.  CT images were obtained.  Surface markings were placed.  The CT images were loaded into the planning software.  Then the target and avoidance structures were contoured.  Treatment planning then occurred.  The radiation prescription was entered and confirmed.  Then, I designed and supervised the construction of a total of 3 medically necessary complex treatment devices, BodyFix to immobilize legs and 2 MLCs to shield critical bowel.  I have requested : Isodose Plan.   PLAN:  The patient will receive 30 Gy in 10 fractions.  ________________________________  Sheral Apley Tammi Klippel, M.D.  This document serves as a record of services personally performed by Tyler Pita, MD. It was created on his behalf by Darcus Austin, a trained medical scribe. The creation of this record is based on the scribe's personal observations and the provider's statements to them. This document has been checked and approved by the attending provider.

## 2016-04-10 NOTE — Progress Notes (Signed)
Radiation Oncology         [phone] ________________________________  Initial Outpatient Consultation  Name: Dwayne Boone MRN: [id number]  Date: 04/10/2016  DOB: [birth year]  CC:No PCP Per Patient  Wyatt Portela, MD   REFERRING PHYSICIAN: Wyatt Portela, MD  DIAGNOSIS: 61 y.o. 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 gentleman. He started to have difficulty urinating, nocturia, and frequency. He was evaluated by urology, Dr. Pilar Jarvis, and was noted to have an elevated PSA of 441.90 on 10/19/15. Her underwent a prostate biopsy on 12/01/15 and this showed adenocarcinoma of the prostate with high volume disease in all 12 cores; Gleason score 4+5=9 in the majority of the cores with a few cores 4+4=8.  Staging workup included a CT scan of the abd/pelvis showing retroperitoneal adenopathy and numerous metastatic bone lesions involving the axial skeleton; including the spine, ribs, and pelvis. Bone scan on 01/01/16 revealed numerous metastatic bone lesions involving the axial skeleton.  Dr. Pilar Jarvis promptly started the patient on androgen deprivation therapy (Lupron 22.5mg  every 3 months and Casodex 50 mg orally once a day) and the patient was referred to Dr. Alen Blew on 01/15/16 who recommended Docetaxel 75mg /m2 every 21 days for 6 cycles.  The patient started palliative chemotherapy on 01/30/16 and he showed excellent response after his first cycle of chemotherapy with his PSA dropping to 39.7 from 441. By the time he was to receive his fourth cycle, his PSA has dropped to 9.8. He has not received Xgeva at this time until he is fully healed from a dental stanpoint.  On 03/27/16, the patient had an MRI of the pelvis for  left sided hip and pelvic pain. This revealed a 7.4 x 4.4 x 5.6 cm heterogeneously enhancing expansile mass involving the left pubic body and left superior pubic ramus (which has significantly enlarged compared to the bone scan on 01/01/2016) consistent with a metastatic malignancy. The mass severely narrowed the obturator foramen and may result in obturator nerve impingement. There was also an incompletely characterized 2.2 x 4.7 cm mass involving the entire left side of the prostate gland most consistent with prostate cancer and multiple small sclerotic bone lesions in the pelvis and lower lumbosacral spine consistent with metastatic prostate cancer.  The patient presents today to discuss palliative radiation to the left pelvic area.  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); Prostate cancer ([place]); PSA elevation (12/04/2015); and Weight loss.    PAST SURGICAL HISTORY: Past Surgical History:  Procedure Laterality Date  . IR GENERIC HISTORICAL  01/25/2016   IR FLUORO GUIDE PORT INSERTION RIGHT 01/25/2016 [name], MD [facility]  . IR GENERIC HISTORICAL  01/25/2016   IR US GUIDE VASC ACCESS RIGHT 01/25/2016 [name], MD [facility]  . none      FAMILY HISTORY: family history includes CVA in his mother; Cancer in his cousin, maternal aunt, maternal uncle, and paternal uncle; Hypertension in his mother.  SOCIAL HISTORY:  reports that he has been smoking Cigarettes.  He has been smoking about 0.50  packs per day. He has never used smokeless tobacco. He reports that he does not drink alcohol or use drugs.  ALLERGIES: Patient has no known allergies.  MEDICATIONS:  Current Outpatient Prescriptions  Medication Sig Dispense Refill  . dolutegravir (TIVICAY) 50 MG tablet Take 1 tablet (50 mg total) by mouth  daily. 30 tablet 11  . emtricitabine-rilpivir-tenofovir AF (ODEFSEY) 200-25-25 MG TABS tablet Take 1 tablet by mouth daily with breakfast. 30 tablet 11  . finasteride (PROSCAR) 5 MG tablet Take 5 mg by mouth daily.  11  . lidocaine-prilocaine (EMLA) cream Apply 1 application topically as needed. 30 g 0  . oxyCODONE (OXY IR/ROXICODONE) 5 MG immediate release tablet Take 1 tablet (5 mg total) by mouth every 4 (four) hours as needed for severe pain. 45 tablet 0  . pravastatin (PRAVACHOL) 40 MG tablet Take 1 tablet (40 mg total) by mouth daily. (Patient not taking: Reported on 04/10/2016) 30 tablet 11  . prochlorperazine (COMPAZINE) 10 MG tablet Take 1 tablet (10 mg total) by mouth every 6 (six) hours as needed for nausea or vomiting. (Patient not taking: Reported on 04/10/2016) 30 tablet 0  . tamsulosin (FLOMAX) 0.4 MG CAPS capsule Take 1 capsule (0.4 mg total) by mouth daily after breakfast. (Patient not taking: Reported on 04/10/2016) 30 capsule 11   No current facility-administered medications for this encounter.     REVIEW OF SYSTEMS:  A 15 point review of systems is documented in the electronic medical record. This was obtained by the nursing staff. However, I reviewed this with the patient to discuss relevant findings and make appropriate changes.  Pertinent items noted in HPI and remainder of comprehensive ROS otherwise negative.Marland Kitchen  His left hip pain has been present for the last 3 months and reports the pain as an 8/10. He takes Ibuprofen that helps a little bit, but is also prescribed oxycodone.   PHYSICAL EXAM:  height is 5\' 11"  (1.803 m) and weight is 129 lb 9.6 oz (58.8 kg). His oral temperature is 97.8 F (36.6 C). His blood pressure is 110/78 and his pulse is 107 (abnormal). His respiration is 16 and oxygen saturation is 100%.  He exhibits no respiratory distress or labored breathing.  He appears neurologically intact. The patient's in no acute distress, but the patient has significant  discomfort with left hip pain. He has to sit on a pad. His gait is notable for favoring his left leg.  KPS = 70  100 - Normal; no complaints; no evidence of disease. 90   - Able to carry on normal activity; minor signs or symptoms of disease. 80   - Normal activity with effort; some signs or symptoms of disease. 35   - Cares for self; unable to carry on normal activity or to do active work. 60   - Requires occasional assistance, but is able to care for most of his personal needs. 50   - Requires considerable assistance and frequent medical care. 57   - Disabled; requires special care and assistance. 26   - Severely disabled; hospital admission is indicated although death not imminent. 33   - Very sick; hospital admission necessary; active supportive treatment necessary. 10   - Moribund; fatal processes progressing rapidly. 0     - Dead  Karnofsky DA, Abelmann WH, Craver LS and Burchenal [facility] [phone]) The use of the nitrogen mustards in the palliative treatment of carcinoma: with particular reference to bronchogenic carcinoma Cancer 1 634-56   LABORATORY DATA:  Lab Results  Component Value Date   WBC 8.5 04/02/2016   HGB 12.9 (L) 04/02/2016   HCT 38.8 04/02/2016   MCV 98.0 04/02/2016   PLT 319 04/02/2016   Lab Results  Component Value Date   NA 143 04/02/2016   K 4.0 04/02/2016   CL 107 01/25/2016   CO2 24 04/02/2016   Lab Results  Component Value Date   ALT 21 04/02/2016   AST 20 04/02/2016   ALKPHOS 133 04/02/2016   BILITOT 0.34 04/02/2016     RADIOGRAPHY: Mr Pelvis W Wo Contrast  Result Date: 03/27/2016 CLINICAL DATA:  Left-sided hip pain, pelvic pain EXAM: MRI PELVIS WITHOUT AND WITH CONTRAST TECHNIQUE: Multiplanar multisequence MR imaging of the pelvis was performed both before and after administration of intravenous contrast. CONTRAST:  73mL MULTIHANCE GADOBENATE DIMEGLUMINE 529 MG/ML IV SOLN COMPARISON:  Bone scan 01/01/2016, CT abdomen/pelvis 01/01/2016 FINDINGS:  Bones: No hip fracture, dislocation or avascular necrosis. No periosteal reaction or bone destruction. No aggressive osseous lesion. 7.4 x 4.4 x 5.6 cm heterogeneously enhancing expansile mass involving the left pubic body and left superior pubic ramus with surrounding soft tissue edema which has significantly enlarged compared with 01/01/2016. The mass severely narrows the obturator foramen. 10 mm low signal lesion in the right and left inferior pubic ramus. Small sclerotic lesion measuring 13 mm in the left superior acetabulum. L4, L5 and S1 vertebral bodies. 2.3 cm bone lesion in the left inferior pubic ramus with surrounding marrow edema and possible nondisplaced pathologic fracture. Normal sacrum and sacroiliac joints. No SI joint widening or erosive changes. Articular cartilage and labrum Articular cartilage:  No chondral defect. Labrum: Grossly intact, but evaluation is limited by lack of intraarticular fluid. Joint or bursal effusion Joint effusion:  No hip joint effusion.  No SI joint effusion. Bursae:  No bursa formation. Muscles and tendons Flexors: Normal. Extensors: Normal. Abductors: Normal. Adductors: Edema in the adductor brevis, operator externus and adductor longus muscles. Gluteals: Normal. Hamstrings: Normal. Other findings Miscellaneous: Incompletely characterized 2.2 x 4.7 cm mass involving the entire left side of the prostate gland most consistent with prostate cancer. No fluid collection or hematoma. No inguinal lymphadenopathy. No inguinal hernia. IMPRESSION: 1. 7.4 x 4.4 x 5.6 cm heterogeneously enhancing expansile mass involving the left pubic body and left superior pubic ramus which has significantly enlarged compared with 01/01/2016 consistent with metastatic malignancy. The mass severely narrows the obturator foramen and may result in obturator nerve impingement. 2. Incompletely characterized 2.2 x 4.7 cm mass involving the entire left side of the prostate gland most consistent with  prostate cancer. 3. Multiple small sclerotic bone lesions in the pelvis and lower lumbosacral spine consistent with metastatic prostate cancer. Electronically Signed   By: Kathreen Devoid   On: 03/27/2016 09:59      IMPRESSION: This gentleman is a 61 y.o. gentleman with metastatic adenocarcinoma of the prostate with a Gleason's score of 4+5=9, a PSA of 441.90, and painful left hip metastasis.  The patient may be a good candidate for palliative radiation to the left hip area for the goal of pain alleviation. The patient understands that this form of treatment is not curative in nature.  PLAN: Today, I talked to the patient about the findings and work-up thus far.  We discussed the natural history of metastatic adenocarcinoma of the prostate and general treatment, highlighting the role of radiotherapy in the management.  We discussed the available radiation techniques, and focused on the details of logistics and delivery.  We reviewed  the anticipated acute and late sequelae associated with radiation in this setting.  The patient was encouraged to ask questions that I answered to the best of my ability.  I filled out a patient counseling form during our discussion including treatment diagrams.  We retained a copy for our records.  The patient would like to proceed with radiation and will be scheduled for CT simulation today at 10AM.  I spent 40 minutes face to face with the patient and more than 50% of that time was spent in counseling and/or coordination of care.   ------------------------------------------------  Sheral Apley. Tammi Klippel, M.D.  This document serves as a record of services personally performed by Tyler Pita, MD. It was created on his behalf by Darcus Austin, a trained medical scribe. The creation of this record is based on the scribe's personal observations and the provider's statements to them. This document has been checked and approved by the attending provider.

## 2016-04-11 ENCOUNTER — Ambulatory Visit
Admission: RE | Admit: 2016-04-11 | Discharge: 2016-04-11 | Disposition: A | Discharge status: Home / Self Care | Payer: Medicare Other | Source: Ambulatory Visit | Attending: Radiation Oncology | Admitting: Radiation Oncology

## 2016-04-11 DIAGNOSIS — C7951 Secondary malignant neoplasm of bone: Secondary | ICD-10-CM | POA: Diagnosis not present

## 2016-04-11 DIAGNOSIS — C61 Malignant neoplasm of prostate: Secondary | ICD-10-CM | POA: Diagnosis not present

## 2016-04-11 DIAGNOSIS — Z510 Encounter for antineoplastic radiation therapy: Secondary | ICD-10-CM | POA: Diagnosis not present

## 2016-04-11 DIAGNOSIS — C7952 Secondary malignant neoplasm of bone marrow: Secondary | ICD-10-CM | POA: Diagnosis not present

## 2016-04-11 DIAGNOSIS — B20 Human immunodeficiency virus [HIV] disease: Secondary | ICD-10-CM | POA: Diagnosis not present

## 2016-04-11 DIAGNOSIS — F17210 Nicotine dependence, cigarettes, uncomplicated: Secondary | ICD-10-CM | POA: Diagnosis not present

## 2016-04-12 ENCOUNTER — Ambulatory Visit
Admission: RE | Admit: 2016-04-12 | Discharge: 2016-04-12 | Disposition: A | Discharge status: Home / Self Care | Payer: Medicare Other | Source: Ambulatory Visit | Attending: Radiation Oncology | Admitting: Radiation Oncology

## 2016-04-12 ENCOUNTER — Encounter: Payer: Self-pay | Admitting: Medical Oncology

## 2016-04-12 DIAGNOSIS — C7952 Secondary malignant neoplasm of bone marrow: Secondary | ICD-10-CM | POA: Diagnosis not present

## 2016-04-12 DIAGNOSIS — Z510 Encounter for antineoplastic radiation therapy: Secondary | ICD-10-CM | POA: Diagnosis not present

## 2016-04-12 DIAGNOSIS — C7951 Secondary malignant neoplasm of bone: Secondary | ICD-10-CM | POA: Diagnosis not present

## 2016-04-12 DIAGNOSIS — C61 Malignant neoplasm of prostate: Secondary | ICD-10-CM | POA: Diagnosis not present

## 2016-04-12 DIAGNOSIS — B20 Human immunodeficiency virus [HIV] disease: Secondary | ICD-10-CM | POA: Diagnosis not present

## 2016-04-12 DIAGNOSIS — F17210 Nicotine dependence, cigarettes, uncomplicated: Secondary | ICD-10-CM | POA: Diagnosis not present

## 2016-04-12 NOTE — Progress Notes (Signed)
Day 2 of radiation to left hip. Mr. Altice states tolerating radiation but continues to have pain.

## 2016-04-15 ENCOUNTER — Ambulatory Visit
Admission: RE | Admit: 2016-04-15 | Discharge: 2016-04-15 | Disposition: A | Discharge status: Home / Self Care | Payer: Medicare Other | Source: Ambulatory Visit | Attending: Radiation Oncology | Admitting: Radiation Oncology

## 2016-04-15 VITALS — BP 124/77 | HR 100 | Resp 16 | Wt 131.0 lb

## 2016-04-15 DIAGNOSIS — C61 Malignant neoplasm of prostate: Secondary | ICD-10-CM | POA: Diagnosis not present

## 2016-04-15 DIAGNOSIS — F17210 Nicotine dependence, cigarettes, uncomplicated: Secondary | ICD-10-CM | POA: Diagnosis not present

## 2016-04-15 DIAGNOSIS — C7951 Secondary malignant neoplasm of bone: Secondary | ICD-10-CM

## 2016-04-15 DIAGNOSIS — B20 Human immunodeficiency virus [HIV] disease: Secondary | ICD-10-CM | POA: Diagnosis not present

## 2016-04-15 DIAGNOSIS — Z510 Encounter for antineoplastic radiation therapy: Secondary | ICD-10-CM | POA: Diagnosis not present

## 2016-04-15 DIAGNOSIS — C7952 Secondary malignant neoplasm of bone marrow: Secondary | ICD-10-CM | POA: Diagnosis not present

## 2016-04-15 NOTE — Progress Notes (Signed)
  Radiation Oncology         [phone]   Name: Dwayne Boone MRN: [id number]   Date: 04/15/2016  DOB: 1955-07-08   Weekly Radiation Therapy Management    ICD-9-CM ICD-10-CM   1. Bone metastasis (HCC) 198.5 C79.51     Current Dose: 9 Gy  Planned Dose:  30 Gy  Narrative The patient presents for routine under treatment assessment.  Weight and vitals stable. The patient reports left hip pain, 8/10 in severity. He reports the last time he took Oxy IR was yesterday, but he experienced no pain relief. He took Ibuprofen last night with minimal pain relief. He reports difficulty sleeping due to pain. He denies edema of left leg, but reports edema in the left groin area. The patient reports constipation and burning discomfort associated with bowel movement. The patient reports he has chemotherapy on 04/23/16, and questions if that will interfere with radiation.  The patient is without complaint. Set-up films were reviewed. The chart was checked.  Physical Findings  weight is 131 lb (59.4 kg). His blood pressure is 124/77 and his pulse is 100. His respiration is 16 and oxygen saturation is 100%.  Weight essentially stable.  No significant changes.  Impression The patient is tolerating radiation.  Plan Continue treatment as planned. I encouraged the patient that chemotherapy treatments on the same day as radiation therapy should not be an issue.         Sheral Apley Tammi Klippel, M.D.  This document serves as a record of services personally performed by Tyler Pita, MD. It was created on his behalf by Maryla Morrow, a trained medical scribe. The creation of this record is based on the scribe's personal observations and the provider's statements to them. This document has been checked and approved by the attending provider.

## 2016-04-15 NOTE — Progress Notes (Signed)
Weight and vitals stable. Reports left hip pain 8 on a scale of 0-10. Reports the last time he took Oxy IR was yesterday. Reports no relief of pain after taking Oxy IR. Reports taking Ibuprofen last night with minimal relief of pain.  Denies edema of left leg but, does reports edema in left groin. Reports constipation and burning discomfort associated with bowel movement. Reports difficulty sleeping due to pain.   BP 124/77 (BP Location: Left Arm, Patient Position: Sitting, Cuff Size: Normal)   Pulse 100   Resp 16   Wt 131 lb (59.4 kg)   SpO2 100%   BMI 18.27 kg/m  Wt Readings from Last 3 Encounters:  04/15/16 131 lb (59.4 kg)  04/10/16 129 lb 9.6 oz (58.8 kg)  04/02/16 133 lb 4.8 oz (60.5 kg)

## 2016-04-16 ENCOUNTER — Ambulatory Visit
Admission: RE | Admit: 2016-04-16 | Discharge: 2016-04-16 | Disposition: A | Discharge status: Home / Self Care | Payer: Medicare Other | Source: Ambulatory Visit | Attending: Radiation Oncology | Admitting: Radiation Oncology

## 2016-04-16 DIAGNOSIS — Z510 Encounter for antineoplastic radiation therapy: Secondary | ICD-10-CM | POA: Diagnosis not present

## 2016-04-16 DIAGNOSIS — C61 Malignant neoplasm of prostate: Secondary | ICD-10-CM | POA: Diagnosis not present

## 2016-04-16 DIAGNOSIS — F17210 Nicotine dependence, cigarettes, uncomplicated: Secondary | ICD-10-CM | POA: Diagnosis not present

## 2016-04-16 DIAGNOSIS — B20 Human immunodeficiency virus [HIV] disease: Secondary | ICD-10-CM | POA: Diagnosis not present

## 2016-04-16 DIAGNOSIS — C7952 Secondary malignant neoplasm of bone marrow: Secondary | ICD-10-CM | POA: Diagnosis not present

## 2016-04-16 DIAGNOSIS — C7951 Secondary malignant neoplasm of bone: Secondary | ICD-10-CM | POA: Diagnosis not present

## 2016-04-17 ENCOUNTER — Ambulatory Visit
Admission: RE | Admit: 2016-04-17 | Discharge: 2016-04-17 | Disposition: A | Discharge status: Home / Self Care | Payer: Medicare Other | Source: Ambulatory Visit | Attending: Radiation Oncology | Admitting: Radiation Oncology

## 2016-04-17 DIAGNOSIS — Z510 Encounter for antineoplastic radiation therapy: Secondary | ICD-10-CM | POA: Diagnosis not present

## 2016-04-17 DIAGNOSIS — C7952 Secondary malignant neoplasm of bone marrow: Secondary | ICD-10-CM | POA: Diagnosis not present

## 2016-04-17 DIAGNOSIS — C7951 Secondary malignant neoplasm of bone: Secondary | ICD-10-CM | POA: Diagnosis not present

## 2016-04-17 DIAGNOSIS — F17210 Nicotine dependence, cigarettes, uncomplicated: Secondary | ICD-10-CM | POA: Diagnosis not present

## 2016-04-17 DIAGNOSIS — C61 Malignant neoplasm of prostate: Secondary | ICD-10-CM | POA: Diagnosis not present

## 2016-04-17 DIAGNOSIS — B20 Human immunodeficiency virus [HIV] disease: Secondary | ICD-10-CM | POA: Diagnosis not present

## 2016-04-18 ENCOUNTER — Encounter: Payer: Self-pay | Admitting: *Deleted

## 2016-04-18 ENCOUNTER — Ambulatory Visit
Admission: RE | Admit: 2016-04-18 | Discharge: 2016-04-18 | Disposition: A | Discharge status: Home / Self Care | Payer: Medicare Other | Source: Ambulatory Visit | Attending: Radiation Oncology | Admitting: Radiation Oncology

## 2016-04-18 DIAGNOSIS — C7951 Secondary malignant neoplasm of bone: Secondary | ICD-10-CM | POA: Diagnosis not present

## 2016-04-18 DIAGNOSIS — Z510 Encounter for antineoplastic radiation therapy: Secondary | ICD-10-CM | POA: Diagnosis not present

## 2016-04-18 DIAGNOSIS — F17210 Nicotine dependence, cigarettes, uncomplicated: Secondary | ICD-10-CM | POA: Diagnosis not present

## 2016-04-18 DIAGNOSIS — C61 Malignant neoplasm of prostate: Secondary | ICD-10-CM

## 2016-04-18 DIAGNOSIS — C7952 Secondary malignant neoplasm of bone marrow: Secondary | ICD-10-CM | POA: Diagnosis not present

## 2016-04-18 DIAGNOSIS — B20 Human immunodeficiency virus [HIV] disease: Secondary | ICD-10-CM | POA: Diagnosis not present

## 2016-04-18 NOTE — Progress Notes (Signed)
  Radiation Oncology         [phone]   Name: Dwayne Boone MRN: [id number]   Date: 04/18/2016  DOB: July 26, 1955   Weekly Radiation Therapy Management    ICD-9-CM ICD-10-CM   1. Prostate cancer ([facility]) 185 C61     Current Dose: 18 Gy  Planned Dose:  30 Gy  Narrative The patient presents for routine under treatment assessment.  Weight and vitals stable. The patient reports left hip pain, 4-5/10 in severity.   The patient is without complaint. Set-up films were reviewed. The chart was checked.  Physical Findings  Weight is 131.  No significant changes.  Impression The patient is tolerating radiation.  Plan Continue treatment as planned.         Sheral Apley Tammi Klippel, M.D.  This document serves as a record of services personally performed by Dwayne Pita, MD. It was created on his behalf by Bethann Humble, a trained medical scribe. The creation of this record is based on the scribe's personal observations and the provider's statements to them. This document has been checked and approved by the attending provider.

## 2016-04-19 ENCOUNTER — Ambulatory Visit
Admission: RE | Admit: 2016-04-19 | Discharge: 2016-04-19 | Disposition: A | Discharge status: Home / Self Care | Payer: Medicare Other | Source: Ambulatory Visit | Attending: Radiation Oncology | Admitting: Radiation Oncology

## 2016-04-19 DIAGNOSIS — Z510 Encounter for antineoplastic radiation therapy: Secondary | ICD-10-CM | POA: Diagnosis not present

## 2016-04-19 DIAGNOSIS — C7951 Secondary malignant neoplasm of bone: Secondary | ICD-10-CM | POA: Diagnosis not present

## 2016-04-19 DIAGNOSIS — F17210 Nicotine dependence, cigarettes, uncomplicated: Secondary | ICD-10-CM | POA: Diagnosis not present

## 2016-04-19 DIAGNOSIS — B20 Human immunodeficiency virus [HIV] disease: Secondary | ICD-10-CM | POA: Diagnosis not present

## 2016-04-19 DIAGNOSIS — C61 Malignant neoplasm of prostate: Secondary | ICD-10-CM | POA: Diagnosis not present

## 2016-04-19 DIAGNOSIS — C7952 Secondary malignant neoplasm of bone marrow: Secondary | ICD-10-CM | POA: Diagnosis not present

## 2016-04-22 ENCOUNTER — Ambulatory Visit
Admission: RE | Admit: 2016-04-22 | Discharge: 2016-04-22 | Disposition: A | Discharge status: Home / Self Care | Payer: Medicare Other | Source: Ambulatory Visit | Attending: Radiation Oncology | Admitting: Radiation Oncology

## 2016-04-22 DIAGNOSIS — C61 Malignant neoplasm of prostate: Secondary | ICD-10-CM | POA: Diagnosis not present

## 2016-04-22 DIAGNOSIS — C7952 Secondary malignant neoplasm of bone marrow: Secondary | ICD-10-CM | POA: Diagnosis not present

## 2016-04-22 DIAGNOSIS — Z510 Encounter for antineoplastic radiation therapy: Secondary | ICD-10-CM | POA: Diagnosis not present

## 2016-04-22 DIAGNOSIS — F17210 Nicotine dependence, cigarettes, uncomplicated: Secondary | ICD-10-CM | POA: Diagnosis not present

## 2016-04-22 DIAGNOSIS — B20 Human immunodeficiency virus [HIV] disease: Secondary | ICD-10-CM | POA: Diagnosis not present

## 2016-04-22 DIAGNOSIS — C7951 Secondary malignant neoplasm of bone: Secondary | ICD-10-CM | POA: Diagnosis not present

## 2016-04-23 ENCOUNTER — Other Ambulatory Visit (HOSPITAL_BASED_OUTPATIENT_CLINIC_OR_DEPARTMENT_OTHER): Payer: Medicare Other

## 2016-04-23 ENCOUNTER — Ambulatory Visit (HOSPITAL_BASED_OUTPATIENT_CLINIC_OR_DEPARTMENT_OTHER): Payer: Medicare Other

## 2016-04-23 ENCOUNTER — Ambulatory Visit
Admission: RE | Admit: 2016-04-23 | Discharge: 2016-04-23 | Disposition: A | Discharge status: Home / Self Care | Payer: Medicare Other | Source: Ambulatory Visit | Attending: Radiation Oncology | Admitting: Radiation Oncology

## 2016-04-23 ENCOUNTER — Ambulatory Visit (HOSPITAL_BASED_OUTPATIENT_CLINIC_OR_DEPARTMENT_OTHER): Payer: Medicare Other | Admitting: Oncology

## 2016-04-23 VITALS — BP 118/77 | HR 112 | Temp 98.4°F | Resp 18 | Ht 71.0 in | Wt 132.8 lb

## 2016-04-23 DIAGNOSIS — C61 Malignant neoplasm of prostate: Secondary | ICD-10-CM

## 2016-04-23 DIAGNOSIS — Z5111 Encounter for antineoplastic chemotherapy: Secondary | ICD-10-CM | POA: Diagnosis present

## 2016-04-23 DIAGNOSIS — C7951 Secondary malignant neoplasm of bone: Secondary | ICD-10-CM

## 2016-04-23 DIAGNOSIS — C7952 Secondary malignant neoplasm of bone marrow: Secondary | ICD-10-CM | POA: Diagnosis not present

## 2016-04-23 DIAGNOSIS — B20 Human immunodeficiency virus [HIV] disease: Secondary | ICD-10-CM

## 2016-04-23 DIAGNOSIS — Z510 Encounter for antineoplastic radiation therapy: Secondary | ICD-10-CM | POA: Diagnosis not present

## 2016-04-23 DIAGNOSIS — F17210 Nicotine dependence, cigarettes, uncomplicated: Secondary | ICD-10-CM | POA: Diagnosis not present

## 2016-04-23 LAB — COMPREHENSIVE METABOLIC PANEL
ALK PHOS: 109 U/L (ref 40–150)
ALT: 12 U/L (ref 0–55)
ANION GAP: 8 meq/L (ref 3–11)
AST: 14 U/L (ref 5–34)
Albumin: 3.5 g/dL (ref 3.5–5.0)
BUN: 20.3 mg/dL (ref 7.0–26.0)
CALCIUM: 9.9 mg/dL (ref 8.4–10.4)
CHLORIDE: 108 meq/L (ref 98–109)
CO2: 27 mEq/L (ref 22–29)
Creatinine: 0.8 mg/dL (ref 0.7–1.3)
EGFR: >90 mL/min/{1.73_m2} (ref >90)
Glucose: 96 mg/dl (ref 70–140)
POTASSIUM: 4.1 meq/L (ref 3.5–5.1)
Sodium: 143 mEq/L (ref 136–145)
Total Bilirubin: 0.35 mg/dL (ref 0.20–1.20)
Total Protein: 7 g/dL (ref 6.4–8.3)

## 2016-04-23 LAB — CBC WITH DIFFERENTIAL/PLATELET
BASO%: 0.2 % (ref 0.0–2.0)
BASOS ABS: 0 10*3/uL (ref 0.0–0.1)
EOS%: 0.2 % (ref 0.0–7.0)
Eosinophils Absolute: 0 10*3/uL (ref 0.0–0.5)
HEMATOCRIT: 35.4 % — AB (ref 38.4–49.9)
HGB: 11.7 g/dL — ABNORMAL LOW (ref 13.0–17.1)
LYMPH#: 1.3 10*3/uL (ref 0.9–3.3)
LYMPH%: 14.5 % (ref 14.0–49.0)
MCH: 33.1 pg (ref 27.2–33.4)
MCHC: 33.1 g/dL (ref 32.0–36.0)
MCV: 100.3 fL — ABNORMAL HIGH (ref 79.3–98.0)
MONO#: 1 10*3/uL — AB (ref 0.1–0.9)
MONO%: 10.8 % (ref 0.0–14.0)
NEUT#: 6.5 10*3/uL (ref 1.5–6.5)
NEUT%: 74.3 % (ref 39.0–75.0)
PLATELETS: 363 10*3/uL (ref 140–400)
RBC: 3.53 10*6/uL — ABNORMAL LOW (ref 4.20–5.82)
RDW: 17.9 % — ABNORMAL HIGH (ref 11.0–14.6)
WBC: 8.8 10*3/uL (ref 4.0–10.3)

## 2016-04-23 MED ORDER — DOCETAXEL CHEMO INJECTION 160 MG/16ML
75.0000 mg/m2 | Freq: Once | INTRAVENOUS | Status: AC
  Administered 2016-04-23: 130 mg via INTRAVENOUS
  Filled 2016-04-23: qty 13

## 2016-04-23 MED ORDER — SODIUM CHLORIDE 0.9% FLUSH
10.0000 mL | INTRAVENOUS | Status: DC | PRN
  Administered 2016-04-23: 10 mL
  Filled 2016-04-23: qty 10

## 2016-04-23 MED ORDER — DEXAMETHASONE SODIUM PHOSPHATE 10 MG/ML IJ SOLN
10.0000 mg | Freq: Once | INTRAMUSCULAR | Status: AC
  Administered 2016-04-23: 10 mg via INTRAVENOUS

## 2016-04-23 MED ORDER — DEXAMETHASONE SODIUM PHOSPHATE 10 MG/ML IJ SOLN
INTRAMUSCULAR | Status: AC
  Filled 2016-04-23: qty 1

## 2016-04-23 MED ORDER — SODIUM CHLORIDE 0.9 % IV SOLN
Freq: Once | INTRAVENOUS | Status: AC
  Administered 2016-04-23: 10:00:00 via INTRAVENOUS

## 2016-04-23 MED ORDER — HEPARIN SOD (PORK) LOCK FLUSH 100 UNIT/ML IV SOLN
500.0000 [IU] | Freq: Once | INTRAVENOUS | Status: AC | PRN
  Administered 2016-04-23: 500 [IU]
  Filled 2016-04-23: qty 5

## 2016-04-23 NOTE — Progress Notes (Signed)
Hematology and Oncology Follow Up Visit  Dwayne Boone [id number] February 22, 1956 61 y.o. 04/23/2016 9:35 AM No PCP Per PatientNo ref. provider found   Principle Diagnosis: 61 year old with prostate cancer diagnosed in 2017. He presented with a PSA of 451 and a Gleason score 4+5 = 9. He has high volume disease with all 12 cores of his prostate involved with high-grade cancer. His staging workup showed metastatic disease including retroperitoneal adenopathy as well as diffuse bony metastasis   Prior Therapy: He underwent a prostate biopsy on 12/01/2015. The biopsy showed prostate adenocarcinoma with high-volume disease in all 12 cores. His Gleason score was 4+5 = 9 and the majority of the cores with few cores that had 4+4 = 8.  Current therapy:  Androgen deprivation therapy under the care of Dr.Budzyn at [facility]. Taxotere chemotherapy at 75 mg/m started on 01/30/2016. Chemotherapy will be given every 3 weeks for a total of 6 treatments. He is here for cycle 5.  Palliative radiation therapy to the left hip. Last fraction scheduled on 04/24/2016.  Interim History: Dwayne Boone presents today for a follow-up visit. Since the last visit, he is receiving palliative radiation therapy to the left hip. He reports his pain is slightly improved but continues to require oxycodone periodically. He also uses ibuprofen which have helped his symptoms even better. He denied any falls, instability or neurological deficits. He continues to ambulate and bear weight without any problems.  He received the last cycle of chemotherapy and tolerated it well so far. He denied any nausea, vomiting or dyspepsia. He denied any excessive fatigue or tiredness. He denied any peripheral neuropathy. He denied any infusion-related complications. He continues to attend to her activities of daily living including driving. His appetite is not dramatically changed although he is up a few pounds.  He denied any headaches, blurry  vision, syncope or seizures. He does not report any fevers, chills or sweats. He does not report any chest pain, palpitation, orthopnea or leg edema. He does not report any cough, wheezing or hemoptysis. He does not report any nausea, vomiting, abdominal pain but does report occasional constipation. He does not report any hematuria or dysuria. He does not report any skeletal complaints of arthralgias or myalgias. Remaining review of systems unremarkable.   Medications: I have reviewed the patient's current medications.  Current Outpatient Prescriptions  Medication Sig Dispense Refill  . dolutegravir (TIVICAY) 50 MG tablet Take 1 tablet (50 mg total) by mouth daily. 30 tablet 11  . emtricitabine-rilpivir-tenofovir AF (ODEFSEY) 200-25-25 MG TABS tablet Take 1 tablet by mouth daily with breakfast. 30 tablet 11  . finasteride (PROSCAR) 5 MG tablet Take 5 mg by mouth daily.  11  . lidocaine-prilocaine (EMLA) cream Apply 1 application topically as needed. 30 g 0  . oxyCODONE (OXY IR/ROXICODONE) 5 MG immediate release tablet Take 1 tablet (5 mg total) by mouth every 4 (four) hours as needed for severe pain. 45 tablet 0  . pravastatin (PRAVACHOL) 40 MG tablet Take 1 tablet (40 mg total) by mouth daily. 30 tablet 11  . prochlorperazine (COMPAZINE) 10 MG tablet Take 1 tablet (10 mg total) by mouth every 6 (six) hours as needed for nausea or vomiting. 30 tablet 0  . tamsulosin (FLOMAX) 0.4 MG CAPS capsule Take 1 capsule (0.4 mg total) by mouth daily after breakfast. 30 capsule 11   No current facility-administered medications for this visit.      Allergies: No Known Allergies  Past Medical History, Surgical history, Social history,  and Family History were reviewed and updated.   Physical Exam: Blood pressure 118/77, pulse (!) 112, temperature 98.4 F (36.9 C), temperature source Oral, resp. rate 18, height 5\' 11"  (1.803 m), weight 132 lb 12.8 oz (60.2 kg), SpO2 100 %. ECOG: 0 General appearance:  Well-appearing man without distress. Head: Normocephalic, without obvious abnormality no oral ulcers or lesions. Neck: no adenopathy Lymph nodes: Cervical, supraclavicular, and axillary nodes normal. Heart:regular rate and rhythm, S1, S2 normal, no murmur, click, rub or gallop Lung:chest clear, no wheezing, rales, normal symmetric air entry Abdomin: soft, non-tender, without masses or organomegaly no shifting dullness or ascites. EXT:no erythema, induration, or nodules.  He had good range of motion in his left hip. No point tenderness.  Neurological examination: He had no deficits. No motor, sensory deficits. .  Lab Results: Lab Results  Component Value Date   WBC 8.8 04/23/2016   HGB 11.7 (L) 04/23/2016   HCT 35.4 (L) 04/23/2016   MCV 100.3 (H) 04/23/2016   PLT 363 04/23/2016     Chemistry      Component Value Date/Time   NA 143 04/02/2016 0809   K 4.0 04/02/2016 0809   CL 107 01/25/2016 0810   CO2 24 04/02/2016 0809   BUN 20.9 04/02/2016 0809   CREATININE 0.9 04/02/2016 0809      Component Value Date/Time   CALCIUM 10.6 (H) 04/02/2016 0809   ALKPHOS 133 04/02/2016 0809   AST 20 04/02/2016 0809   ALT 21 04/02/2016 0809   BILITOT 0.34 04/02/2016 0809       Results for Dwayne Boone, Dwayne Boone (MRN [id number]) as of 04/23/2016 09:25  Ref. Range 03/12/2016 07:48 04/02/2016 08:09  PSA Latest Ref Range: 0.0 - 4.0 ng/mL 9.8 (H) 8.7 (H)     Impression and Plan:  61 year old gentleman with the following issues:  1. Prostate cancer diagnosed in August 2017. He presented with a PSA of 451 and a Gleason score 4+5 = 9. He has high volume disease with all 12 cores of his prostate involved with high-grade cancer. His staging workup showed metastatic disease including retroperitoneal adenopathy as well as diffuse bony metastasis. He has been started on androgen deprivation with Lupron.  He is currently receiving Taxotere chemotherapy and continues to tolerated well. His PSA continues to  respond down to 8.7. The plan is to continue with systemic chemotherapy and proceed with cycle 5 without any dose reduction or delay. The plan is to complete total 6 cycles.  After completion of 6 cycles, we will monitor his PSA closely and consider adding Zytiga if his PSA starts to rise.  2. IV access: Port-A-Cath is being utilized without complications.  3. Androgen deprivation: He continues to receive Lupron and will continue indefinitely.  4. Bone directed therapy: He has not received Xgeva at this time awaiting dental clearance. We'll await start of Xgeva until he is fully healed from a dental standpoint.  5. Antiemetics: Prescription for Compazine available to the patient. This has not been a big issue at this time.  7. HIV: He is followed by Dr.Van Dam. No recent complications noted.  8. Hip pain: MRI of the hip was reviewed and showed a large mass in the left pelvic area involving the left pubic body in the left pubic ramus. He is receiving palliative radiation therapy which have helped his symptoms at this time.  9. Follow-up: Will be in 3 weeks for the next cycle of chemotherapy.      [facility], MD 1/16/20189:35 AM

## 2016-04-23 NOTE — Patient Instructions (Signed)
[facility] Discharge Instructions for Patients Receiving Chemotherapy  Today you received the following chemotherapy agents:  Taxotere  To help prevent nausea and vomiting after your treatment, we encourage you to take your nausea medication as ordered per MD.   If you develop nausea and vomiting that is not controlled by your nausea medication, call the clinic.   BELOW ARE SYMPTOMS THAT SHOULD BE REPORTED IMMEDIATELY:  *FEVER GREATER THAN 100.5 F  *CHILLS WITH OR WITHOUT FEVER  NAUSEA AND VOMITING THAT IS NOT CONTROLLED WITH YOUR NAUSEA MEDICATION  *UNUSUAL SHORTNESS OF BREATH  *UNUSUAL BRUISING OR BLEEDING  TENDERNESS IN MOUTH AND THROAT WITH OR WITHOUT PRESENCE OF ULCERS  *URINARY PROBLEMS  *BOWEL PROBLEMS  UNUSUAL RASH Items with * indicate a potential emergency and should be followed up as soon as possible.  Feel free to call the clinic you have any questions or concerns. The clinic phone number is [phone].  Please show the CHEMO ALERT CARD at check-in to the Emergency Department and triage nurse.   

## 2016-04-24 ENCOUNTER — Ambulatory Visit: Payer: Medicare Other

## 2016-04-24 LAB — PSA: Prostate Specific Ag, Serum: 7.4 ng/mL — ABNORMAL HIGH (ref 0.0–4.0)

## 2016-04-25 ENCOUNTER — Encounter: Payer: Self-pay | Admitting: Radiation Oncology

## 2016-04-25 ENCOUNTER — Ambulatory Visit
Admission: RE | Admit: 2016-04-25 | Discharge: 2016-04-25 | Disposition: A | Discharge status: Home / Self Care | Payer: Medicare Other | Source: Ambulatory Visit | Attending: Radiation Oncology | Admitting: Radiation Oncology

## 2016-04-25 ENCOUNTER — Ambulatory Visit (HOSPITAL_BASED_OUTPATIENT_CLINIC_OR_DEPARTMENT_OTHER): Payer: Medicare Other

## 2016-04-25 ENCOUNTER — Ambulatory Visit: Admission: RE | Admit: 2016-04-25 | Payer: Medicare Other | Source: Ambulatory Visit | Admitting: Radiation Oncology

## 2016-04-25 VITALS — BP 135/78 | HR 100 | Temp 97.5°F | Resp 20

## 2016-04-25 DIAGNOSIS — C7951 Secondary malignant neoplasm of bone: Secondary | ICD-10-CM

## 2016-04-25 DIAGNOSIS — F17210 Nicotine dependence, cigarettes, uncomplicated: Secondary | ICD-10-CM | POA: Diagnosis not present

## 2016-04-25 DIAGNOSIS — C7952 Secondary malignant neoplasm of bone marrow: Secondary | ICD-10-CM | POA: Diagnosis not present

## 2016-04-25 DIAGNOSIS — C61 Malignant neoplasm of prostate: Secondary | ICD-10-CM

## 2016-04-25 DIAGNOSIS — B20 Human immunodeficiency virus [HIV] disease: Secondary | ICD-10-CM | POA: Diagnosis not present

## 2016-04-25 DIAGNOSIS — Z510 Encounter for antineoplastic radiation therapy: Secondary | ICD-10-CM | POA: Diagnosis not present

## 2016-04-25 MED ORDER — PEGFILGRASTIM INJECTION 6 MG/0.6ML ~~LOC~~
6.0000 mg | PREFILLED_SYRINGE | Freq: Once | SUBCUTANEOUS | Status: AC
  Administered 2016-04-25: 6 mg via SUBCUTANEOUS
  Filled 2016-04-25: qty 0.6

## 2016-04-25 NOTE — Patient Instructions (Signed)
Pegfilgrastim injection What is this medicine? PEGFILGRASTIM (PEG fil gra stim) is a long-acting granulocyte colony-stimulating factor that stimulates the growth of neutrophils, a type of white blood cell important in the body's fight against infection. It is used to reduce the incidence of fever and infection in patients with certain types of cancer who are receiving chemotherapy that affects the bone marrow, and to increase survival after being exposed to high doses of radiation. This medicine may be used for other purposes; ask your health care provider or pharmacist if you have questions. What should I tell my health care provider before I take this medicine? They need to know if you have any of these conditions: -kidney disease -latex allergy -ongoing radiation therapy -sickle cell disease -skin reactions to acrylic adhesives (On-Body Injector only) -an unusual or allergic reaction to pegfilgrastim, filgrastim, other medicines, foods, dyes, or preservatives -pregnant or trying to get pregnant -breast-feeding How should I use this medicine? This medicine is for injection under the skin. If you get this medicine at home, you will be taught how to prepare and give the pre-filled syringe or how to use the On-body Injector. Refer to the patient Instructions for Use for detailed instructions. Use exactly as directed. Take your medicine at regular intervals. Do not take your medicine more often than directed. It is important that you put your used needles and syringes in a special sharps container. Do not put them in a trash can. If you do not have a sharps container, call your pharmacist or healthcare provider to get one. Talk to your pediatrician regarding the use of this medicine in children. While this drug may be prescribed for selected conditions, precautions do apply. Overdosage: If you think you have taken too much of this medicine contact a poison control center or emergency room at  once. NOTE: This medicine is only for you. Do not share this medicine with others. What if I miss a dose? It is important not to miss your dose. Call your doctor or health care professional if you miss your dose. If you miss a dose due to an On-body Injector failure or leakage, a new dose should be administered as soon as possible using a single prefilled syringe for manual use. What may interact with this medicine? Interactions have not been studied. Give your health care provider a list of all the medicines, herbs, non-prescription drugs, or dietary supplements you use. Also tell them if you smoke, drink alcohol, or use illegal drugs. Some items may interact with your medicine. This list may not describe all possible interactions. Give your health care provider a list of all the medicines, herbs, non-prescription drugs, or dietary supplements you use. Also tell them if you smoke, drink alcohol, or use illegal drugs. Some items may interact with your medicine. What should I watch for while using this medicine? You may need blood work done while you are taking this medicine. If you are going to need a MRI, CT scan, or other procedure, tell your doctor that you are using this medicine (On-Body Injector only). What side effects may I notice from receiving this medicine? Side effects that you should report to your doctor or health care professional as soon as possible: -allergic reactions like skin rash, itching or hives, swelling of the face, lips, or tongue -dizziness -fever -pain, redness, or irritation at site where injected -pinpoint red spots on the skin -red or dark-brown urine -shortness of breath or breathing problems -stomach or side pain, or pain   at the shoulder -swelling -tiredness -trouble passing urine or change in the amount of urine Side effects that usually do not require medical attention (report to your doctor or health care professional if they continue or are  bothersome): -bone pain -muscle pain This list may not describe all possible side effects. Call your doctor for medical advice about side effects. You may report side effects to FDA at 1-800-FDA-1088. Where should I keep my medicine? Keep out of the reach of children. Store pre-filled syringes in a refrigerator between 2 and 8 degrees C (36 and 46 degrees F). Do not freeze. Keep in carton to protect from light. Throw away this medicine if it is left out of the refrigerator for more than 48 hours. Throw away any unused medicine after the expiration date. NOTE: This sheet is a summary. It may not cover all possible information. If you have questions about this medicine, talk to your doctor, pharmacist, or health care provider.    2016, Elsevier/Gold Standard. (2014-04-14 14:30:14)  

## 2016-04-28 NOTE — Progress Notes (Signed)
  Radiation Oncology         [phone]) [phone] ________________________________  Name: Dwayne Boone MRN: [id number]  Date: 04/25/2016  DOB: 1955/06/19  End of Treatment Note  Diagnosis:   61 y.o. 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   Pain improved.  Plan: The patient has completed radiation treatment. The patient will return to radiation oncology clinic for routine followup in one month. I advised him to call or return sooner if he has any questions or concerns related to his recovery or treatment. ________________________________  Sheral Apley. Tammi Klippel, M.D.

## 2016-05-07 ENCOUNTER — Telehealth: Payer: Self-pay | Admitting: *Deleted

## 2016-05-07 NOTE — Telephone Encounter (Signed)
CALLED PATIENT TO RESCHEDULE FU APPT. ON 06-06-16 PER ALISON PERKINS, APPT. RESCHEDULED FOR 06-06-16 @ 10 AM , PATIENT AGREED TO NEW TIME AND DAY

## 2016-05-14 ENCOUNTER — Other Ambulatory Visit (HOSPITAL_BASED_OUTPATIENT_CLINIC_OR_DEPARTMENT_OTHER): Payer: Medicare Other

## 2016-05-14 ENCOUNTER — Ambulatory Visit (HOSPITAL_BASED_OUTPATIENT_CLINIC_OR_DEPARTMENT_OTHER): Payer: Medicare Other | Admitting: Oncology

## 2016-05-14 ENCOUNTER — Other Ambulatory Visit: Payer: Self-pay | Admitting: Infectious Disease

## 2016-05-14 ENCOUNTER — Telehealth: Payer: Self-pay | Admitting: Oncology

## 2016-05-14 ENCOUNTER — Ambulatory Visit (HOSPITAL_BASED_OUTPATIENT_CLINIC_OR_DEPARTMENT_OTHER): Payer: Medicare Other

## 2016-05-14 ENCOUNTER — Other Ambulatory Visit: Payer: Self-pay | Admitting: *Deleted

## 2016-05-14 VITALS — BP 117/73 | HR 101 | Temp 98.1°F | Resp 17 | Ht 71.0 in | Wt 132.9 lb

## 2016-05-14 DIAGNOSIS — Z5111 Encounter for antineoplastic chemotherapy: Secondary | ICD-10-CM | POA: Diagnosis not present

## 2016-05-14 DIAGNOSIS — B20 Human immunodeficiency virus [HIV] disease: Secondary | ICD-10-CM | POA: Diagnosis not present

## 2016-05-14 DIAGNOSIS — C7951 Secondary malignant neoplasm of bone: Secondary | ICD-10-CM

## 2016-05-14 DIAGNOSIS — E785 Hyperlipidemia, unspecified: Secondary | ICD-10-CM

## 2016-05-14 DIAGNOSIS — C61 Malignant neoplasm of prostate: Secondary | ICD-10-CM

## 2016-05-14 LAB — COMPREHENSIVE METABOLIC PANEL
ALT: 11 U/L (ref 0–55)
ANION GAP: 9 meq/L (ref 3–11)
AST: 14 U/L (ref 5–34)
Albumin: 3.8 g/dL (ref 3.5–5.0)
Alkaline Phosphatase: 145 U/L (ref 40–150)
BUN: 13.5 mg/dL (ref 7.0–26.0)
CHLORIDE: 108 meq/L (ref 98–109)
CO2: 27 meq/L (ref 22–29)
CREATININE: 0.8 mg/dL (ref 0.7–1.3)
Calcium: 10.2 mg/dL (ref 8.4–10.4)
EGFR: >90 mL/min/{1.73_m2} (ref >90)
Glucose: 110 mg/dl (ref 70–140)
Potassium: 4.4 mEq/L (ref 3.5–5.1)
Sodium: 144 mEq/L (ref 136–145)
Total Bilirubin: 0.34 mg/dL (ref 0.20–1.20)
Total Protein: 7.2 g/dL (ref 6.4–8.3)

## 2016-05-14 LAB — CBC WITH DIFFERENTIAL/PLATELET
BASO%: 0.3 % (ref 0.0–2.0)
Basophils Absolute: 0 10*3/uL (ref 0.0–0.1)
EOS%: 0.3 % (ref 0.0–7.0)
Eosinophils Absolute: 0 10*3/uL (ref 0.0–0.5)
HCT: 37.5 % — ABNORMAL LOW (ref 38.4–49.9)
HGB: 12 g/dL — ABNORMAL LOW (ref 13.0–17.1)
LYMPH%: 14.4 % (ref 14.0–49.0)
MCH: 33.8 pg — ABNORMAL HIGH (ref 27.2–33.4)
MCHC: 32 g/dL (ref 32.0–36.0)
MCV: 105.6 fL — ABNORMAL HIGH (ref 79.3–98.0)
MONO#: 1.1 10*3/uL — ABNORMAL HIGH (ref 0.1–0.9)
MONO%: 14.9 % — AB (ref 0.0–14.0)
NEUT#: 5.1 10*3/uL (ref 1.5–6.5)
NEUT%: 70.1 % (ref 39.0–75.0)
Platelets: 357 10*3/uL (ref 140–400)
RBC: 3.55 10*6/uL — AB (ref 4.20–5.82)
RDW: 16.9 % — ABNORMAL HIGH (ref 11.0–14.6)
WBC: 7.2 10*3/uL (ref 4.0–10.3)
lymph#: 1 10*3/uL (ref 0.9–3.3)

## 2016-05-14 MED ORDER — OXYCODONE HCL 5 MG PO TABS: 5.0000 mg | ORAL_TABLET | ORAL | 0 refills | Status: DC | PRN

## 2016-05-14 MED ORDER — HEPARIN SOD (PORK) LOCK FLUSH 100 UNIT/ML IV SOLN
500.0000 [IU] | Freq: Once | INTRAVENOUS | Status: AC | PRN
  Administered 2016-05-14: 500 [IU]
  Filled 2016-05-14: qty 5

## 2016-05-14 MED ORDER — SODIUM CHLORIDE 0.9 % IV SOLN
Freq: Once | INTRAVENOUS | Status: AC
  Administered 2016-05-14: 11:00:00 via INTRAVENOUS

## 2016-05-14 MED ORDER — SODIUM CHLORIDE 0.9% FLUSH
10.0000 mL | INTRAVENOUS | Status: DC | PRN
  Administered 2016-05-14: 10 mL
  Filled 2016-05-14: qty 10

## 2016-05-14 MED ORDER — DOCETAXEL CHEMO INJECTION 160 MG/16ML
75.0000 mg/m2 | Freq: Once | INTRAVENOUS | Status: AC
  Administered 2016-05-14: 130 mg via INTRAVENOUS
  Filled 2016-05-14: qty 13

## 2016-05-14 MED ORDER — DEXAMETHASONE SODIUM PHOSPHATE 10 MG/ML IJ SOLN
10.0000 mg | Freq: Once | INTRAMUSCULAR | Status: AC
  Administered 2016-05-14: 10 mg via INTRAVENOUS

## 2016-05-14 MED ORDER — DEXAMETHASONE SODIUM PHOSPHATE 10 MG/ML IJ SOLN
INTRAMUSCULAR | Status: AC
  Filled 2016-05-14: qty 1

## 2016-05-14 NOTE — Telephone Encounter (Signed)
Appointments scheduled per 2/6 LOS. Patient given AVS report and calendars with future scheduled appointments. °

## 2016-05-14 NOTE — Progress Notes (Signed)
Hematology and Oncology Follow Up Visit  Dwayne Boone:[id number] Nov 19, 1955 61 y.o. 05/14/2016 10:04 AM No PCP Per PatientNo ref. provider found   Principle Diagnosis: 61 year old with prostate cancer diagnosed in 2017. He presented with a PSA of 451 and a Gleason score 4+5 = 9. He has high volume disease with all 12 cores of his prostate involved with high-grade cancer. His staging workup showed metastatic disease including retroperitoneal adenopathy as well as diffuse bony metastasis   Prior Therapy: He underwent a prostate biopsy on 12/01/2015. The biopsy showed prostate adenocarcinoma with high-volume disease in all 12 cores. His Gleason score was 4+5 = 9 and the majority of the cores with few cores that had 4+4 = 8. Palliative radiation therapy to the left hip. Last fraction scheduled on 04/24/2016.  Current therapy:  Androgen deprivation therapy under the care of Dr.Budzyn at [facility].  Taxotere chemotherapy at 75 mg/m started on 01/30/2016. Chemotherapy will be given every 3 weeks for a total of 6 treatments. He is here for cycle 6.    Interim History: Dwayne Boone presents today for a follow-up visit. Since the last visit, he completed palliative radiation therapy to the left hip. He reports his pain is much improved but continues to require oxycodone had a much less frequent basis. He also uses ibuprofen which have helped his symptoms even better. He denied any falls, instability or neurological deficits. He continues to ambulate and drive without any falls or syncope.  He received the last cycle of chemotherapy and tolerated it well so far. He denied any nausea, vomiting or dyspepsia. He denied any excessive fatigue or tiredness. He denied any peripheral neuropathy. He denied any infusion-related complications. He is reporting some mild nail changes which to be expected to Taxotere chemotherapy.  He denied any headaches, blurry vision, syncope or seizures. He does not report any  fevers, chills or sweats. He does not report any chest pain, palpitation, orthopnea or leg edema. He does not report any cough, wheezing or hemoptysis. He does not report any nausea, vomiting, abdominal pain but does report occasional constipation. He does not report any hematuria or dysuria. He does not report any skeletal complaints of arthralgias or myalgias. Remaining review of systems unremarkable.   Medications: I have reviewed the patient's current medications.  Current Outpatient Prescriptions  Medication Sig Dispense Refill  . dolutegravir (TIVICAY) 50 MG tablet Take 1 tablet (50 mg total) by mouth daily. 30 tablet 11  . emtricitabine-rilpivir-tenofovir AF (ODEFSEY) 200-25-25 MG TABS tablet Take 1 tablet by mouth daily with breakfast. 30 tablet 11  . finasteride (PROSCAR) 5 MG tablet Take 5 mg by mouth daily.  11  . lidocaine-prilocaine (EMLA) cream Apply 1 application topically as needed. 30 g 0  . pravastatin (PRAVACHOL) 40 MG tablet Take 1 tablet (40 mg total) by mouth daily. 30 tablet 11  . prochlorperazine (COMPAZINE) 10 MG tablet Take 1 tablet (10 mg total) by mouth every 6 (six) hours as needed for nausea or vomiting. 30 tablet 0  . tamsulosin (FLOMAX) 0.4 MG CAPS capsule Take 1 capsule (0.4 mg total) by mouth daily after breakfast. 30 capsule 11  . oxyCODONE (OXY IR/ROXICODONE) 5 MG immediate release tablet Take 1 tablet (5 mg total) by mouth every 4 (four) hours as needed for severe pain. 45 tablet 0   No current facility-administered medications for this visit.      Allergies: No Known Allergies  Past Medical History, Surgical history, Social history, and Family History were  reviewed and updated.   Physical Exam: Blood pressure 117/73, pulse (!) 101, temperature 98.1 F (36.7 C), temperature source Oral, resp. rate 17, height 5\' 11"  (1.803 m), weight 132 lb 14.4 oz (60.3 kg), SpO2 100 %. ECOG: 0 General appearance: Alert, awake gentleman without distress.. Head:  Normocephalic, without obvious abnormality no oral thrush noted. Neck: no adenopathy Lymph nodes: Cervical, supraclavicular, and axillary nodes normal. Heart:regular rate and rhythm, S1, S2 normal, no murmur, click, rub or gallop Lung:chest clear, no wheezing, rales, normal symmetric air entry Abdomin: soft, non-tender, without masses or organomegaly no rebound or guarding. EXT:no erythema, induration, or nodules.  He had good range of motion in his left hip. No point tenderness.  Neurological examination: He had no deficits. No motor, sensory deficits. .  Lab Results: Lab Results  Component Value Date   WBC 7.2 05/14/2016   HGB 12.0 (L) 05/14/2016   HCT 37.5 (L) 05/14/2016   MCV 105.6 (H) 05/14/2016   PLT 357 05/14/2016     Chemistry      Component Value Date/Time   NA 143 04/23/2016 0909   K 4.1 04/23/2016 0909   CL 107 01/25/2016 0810   CO2 27 04/23/2016 0909   BUN 20.3 04/23/2016 0909   CREATININE 0.8 04/23/2016 0909      Component Value Date/Time   CALCIUM 9.9 04/23/2016 0909   ALKPHOS 109 04/23/2016 0909   AST 14 04/23/2016 0909   ALT 12 04/23/2016 0909   BILITOT 0.35 04/23/2016 0909       Results for Dwayne Boone, Dwayne Boone (MRN [id number]) as of 05/14/2016 09:53  Ref. Range 10/19/2015 14:57 01/30/2016 09:34 02/20/2016 07:48 03/12/2016 07:48 04/02/2016 08:09 04/23/2016 09:09  PSA Latest Ref Range: 0.0 - 4.0 Boone/mL 441.90 (H) 39.7 (H) 17.3 (H) 9.8 (H) 8.7 (H) 7.4 (H)      Impression and Plan:  61 year old gentleman with the following issues:  1. Prostate cancer diagnosed in August 2017. He presented with a PSA of 451 and a Gleason score 4+5 = 9. He has high volume disease with all 12 cores of his prostate involved with high-grade cancer. His staging workup showed metastatic disease including retroperitoneal adenopathy as well as diffuse bony metastasis. He has been started on androgen deprivation with Lupron.  He is currently receiving Taxotere chemotherapy and continues  to tolerated well. He is scheduled to receive cycle 6 of chemotherapy today which will be his last cycle.  His PSA continues to respond down to 7.4 at this time.  After completing chemotherapy, he will continue to receive androgen deprivation only and we will add second line hormonal therapy if his PSA starts to rise. These options would be Zytiga versus Xtandi.  2. IV access: Port-A-Cath will be flushed every 6 weeks after completing chemotherapy.  3. Androgen deprivation: He continues to receive Lupron and will continue indefinitely.  4. Bone directed therapy: This was deferred for the time being until he is completely clear from a dental standpoint.  5. Antiemetics: Prescription for Compazine available to the patient. This has not been a big issue at this time.  7. HIV: He is followed by Dr.Van Dam. No recent complications noted.  8. Hip pain: Completed radiation therapy which have improved his pain at this time. I refilled oxycodone for him which she still uses but much less frequently.  9. Follow-up: Will be in 6 weeks to follow his progress.      [facility], MD 2/6/201810:04 AM

## 2016-05-14 NOTE — Patient Instructions (Signed)
[facility] Discharge Instructions for Patients Receiving Chemotherapy  Today you received the following chemotherapy agents:  Taxotere  To help prevent nausea and vomiting after your treatment, we encourage you to take your nausea medication as ordered per MD.   If you develop nausea and vomiting that is not controlled by your nausea medication, call the clinic.   BELOW ARE SYMPTOMS THAT SHOULD BE REPORTED IMMEDIATELY:  *FEVER GREATER THAN 100.5 F  *CHILLS WITH OR WITHOUT FEVER  NAUSEA AND VOMITING THAT IS NOT CONTROLLED WITH YOUR NAUSEA MEDICATION  *UNUSUAL SHORTNESS OF BREATH  *UNUSUAL BRUISING OR BLEEDING  TENDERNESS IN MOUTH AND THROAT WITH OR WITHOUT PRESENCE OF ULCERS  *URINARY PROBLEMS  *BOWEL PROBLEMS  UNUSUAL RASH Items with * indicate a potential emergency and should be followed up as soon as possible.  Feel free to call the clinic you have any questions or concerns. The clinic phone number is [phone].  Please show the CHEMO ALERT CARD at check-in to the Emergency Department and triage nurse.   

## 2016-05-15 DIAGNOSIS — Z8546 Personal history of malignant neoplasm of prostate: Secondary | ICD-10-CM | POA: Diagnosis not present

## 2016-05-15 DIAGNOSIS — R634 Abnormal weight loss: Secondary | ICD-10-CM | POA: Diagnosis not present

## 2016-05-15 DIAGNOSIS — D075 Carcinoma in situ of prostate: Secondary | ICD-10-CM | POA: Diagnosis not present

## 2016-05-15 DIAGNOSIS — B20 Human immunodeficiency virus [HIV] disease: Secondary | ICD-10-CM | POA: Diagnosis not present

## 2016-05-15 LAB — PSA: Prostate Specific Ag, Serum: 10 ng/mL — ABNORMAL HIGH (ref 0.0–4.0)

## 2016-05-16 ENCOUNTER — Ambulatory Visit (HOSPITAL_BASED_OUTPATIENT_CLINIC_OR_DEPARTMENT_OTHER): Payer: Medicare Other

## 2016-05-16 VITALS — BP 109/70 | HR 100 | Temp 98.1°F | Resp 18

## 2016-05-16 DIAGNOSIS — C7951 Secondary malignant neoplasm of bone: Secondary | ICD-10-CM | POA: Diagnosis not present

## 2016-05-16 DIAGNOSIS — C61 Malignant neoplasm of prostate: Secondary | ICD-10-CM | POA: Diagnosis present

## 2016-05-16 MED ORDER — PEGFILGRASTIM INJECTION 6 MG/0.6ML ~~LOC~~
6.0000 mg | PREFILLED_SYRINGE | Freq: Once | SUBCUTANEOUS | Status: AC
  Administered 2016-05-16: 6 mg via SUBCUTANEOUS
  Filled 2016-05-16: qty 0.6

## 2016-05-16 NOTE — Patient Instructions (Signed)
Pegfilgrastim injection What is this medicine? PEGFILGRASTIM (PEG fil gra stim) is a long-acting granulocyte colony-stimulating factor that stimulates the growth of neutrophils, a type of white blood cell important in the body's fight against infection. It is used to reduce the incidence of fever and infection in patients with certain types of cancer who are receiving chemotherapy that affects the bone marrow, and to increase survival after being exposed to high doses of radiation. This medicine may be used for other purposes; ask your health care provider or pharmacist if you have questions. What should I tell my health care provider before I take this medicine? They need to know if you have any of these conditions: -kidney disease -latex allergy -ongoing radiation therapy -sickle cell disease -skin reactions to acrylic adhesives (On-Body Injector only) -an unusual or allergic reaction to pegfilgrastim, filgrastim, other medicines, foods, dyes, or preservatives -pregnant or trying to get pregnant -breast-feeding How should I use this medicine? This medicine is for injection under the skin. If you get this medicine at home, you will be taught how to prepare and give the pre-filled syringe or how to use the On-body Injector. Refer to the patient Instructions for Use for detailed instructions. Use exactly as directed. Take your medicine at regular intervals. Do not take your medicine more often than directed. It is important that you put your used needles and syringes in a special sharps container. Do not put them in a trash can. If you do not have a sharps container, call your pharmacist or healthcare provider to get one. Talk to your pediatrician regarding the use of this medicine in children. While this drug may be prescribed for selected conditions, precautions do apply. Overdosage: If you think you have taken too much of this medicine contact a poison control center or emergency room at  once. NOTE: This medicine is only for you. Do not share this medicine with others. What if I miss a dose? It is important not to miss your dose. Call your doctor or health care professional if you miss your dose. If you miss a dose due to an On-body Injector failure or leakage, a new dose should be administered as soon as possible using a single prefilled syringe for manual use. What may interact with this medicine? Interactions have not been studied. Give your health care provider a list of all the medicines, herbs, non-prescription drugs, or dietary supplements you use. Also tell them if you smoke, drink alcohol, or use illegal drugs. Some items may interact with your medicine. This list may not describe all possible interactions. Give your health care provider a list of all the medicines, herbs, non-prescription drugs, or dietary supplements you use. Also tell them if you smoke, drink alcohol, or use illegal drugs. Some items may interact with your medicine. What should I watch for while using this medicine? You may need blood work done while you are taking this medicine. If you are going to need a MRI, CT scan, or other procedure, tell your doctor that you are using this medicine (On-Body Injector only). What side effects may I notice from receiving this medicine? Side effects that you should report to your doctor or health care professional as soon as possible: -allergic reactions like skin rash, itching or hives, swelling of the face, lips, or tongue -dizziness -fever -pain, redness, or irritation at site where injected -pinpoint red spots on the skin -red or dark-brown urine -shortness of breath or breathing problems -stomach or side pain, or pain   at the shoulder -swelling -tiredness -trouble passing urine or change in the amount of urine Side effects that usually do not require medical attention (report to your doctor or health care professional if they continue or are  bothersome): -bone pain -muscle pain This list may not describe all possible side effects. Call your doctor for medical advice about side effects. You may report side effects to FDA at 1-800-FDA-1088. Where should I keep my medicine? Keep out of the reach of children. Store pre-filled syringes in a refrigerator between 2 and 8 degrees C (36 and 46 degrees F). Do not freeze. Keep in carton to protect from light. Throw away this medicine if it is left out of the refrigerator for more than 48 hours. Throw away any unused medicine after the expiration date. NOTE: This sheet is a summary. It may not cover all possible information. If you have questions about this medicine, talk to your doctor, pharmacist, or health care provider.    2016, Elsevier/Gold Standard. (2014-04-14 14:30:14)  

## 2016-05-27 ENCOUNTER — Telehealth: Payer: Self-pay

## 2016-05-27 NOTE — Telephone Encounter (Signed)
Saturday noticed feet swelling bilaterally and dark on toe nails. Elevation decreases swelling a little bit. No tenderness. Can still tie shoes. Swelling is in feet and ankles. Pt is requesting an appt sooner than 3/22.  Stressed importance of elevation, suggested compression socks 20-30 mmHg.  Docetaxel given 2/6.

## 2016-05-28 DIAGNOSIS — C61 Malignant neoplasm of prostate: Secondary | ICD-10-CM | POA: Diagnosis not present

## 2016-05-28 NOTE — Telephone Encounter (Signed)
These are complications related to Taxotere chemotherapy. Will address with him next visit.

## 2016-05-29 NOTE — Telephone Encounter (Signed)
S/w pt. He reports his feet swelling is improved after getting compression socks yesterday, and keeping feet elevated. Gave him Dr Alen Blew response of addressing this at next visit. Clarified visit dates and times. Pt was appreciative of call.

## 2016-05-30 NOTE — Progress Notes (Signed)
Dwayne Boone 61 y.o. 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

## 2016-06-05 ENCOUNTER — Telehealth: Payer: Self-pay | Admitting: *Deleted

## 2016-06-05 DIAGNOSIS — Z5111 Encounter for antineoplastic chemotherapy: Secondary | ICD-10-CM | POA: Diagnosis not present

## 2016-06-05 DIAGNOSIS — C61 Malignant neoplasm of prostate: Secondary | ICD-10-CM | POA: Diagnosis not present

## 2016-06-05 NOTE — Telephone Encounter (Signed)
CALLED PATIENT TO ASK ABOUT COMING IN EARLIER TO SEE THE PA, PATIENT AGREED TO A 9:15 AM FU

## 2016-06-06 ENCOUNTER — Encounter: Payer: Self-pay | Admitting: Radiation Oncology

## 2016-06-06 ENCOUNTER — Ambulatory Visit
Admission: RE | Admit: 2016-06-06 | Discharge: 2016-06-06 | Disposition: A | Discharge status: Home / Self Care | Payer: Medicare Other | Source: Ambulatory Visit | Attending: Radiation Oncology | Admitting: Radiation Oncology

## 2016-06-06 ENCOUNTER — Inpatient Hospital Stay
Admission: RE | Admit: 2016-06-06 | Discharge: 2016-06-06 | Disposition: A | Discharge status: Home / Self Care | Payer: Self-pay | Source: Ambulatory Visit | Admitting: Urology

## 2016-06-06 ENCOUNTER — Encounter: Payer: Self-pay | Admitting: Urology

## 2016-06-06 ENCOUNTER — Inpatient Hospital Stay
Admission: RE | Admit: 2016-06-06 | Discharge: 2016-06-06 | Disposition: A | Discharge status: Home / Self Care | Payer: Medicare Other | Source: Ambulatory Visit | Attending: Urology | Admitting: Urology

## 2016-06-06 VITALS — BP 136/80 | HR 110 | Temp 97.9°F | Resp 18 | Ht 71.0 in | Wt 132.8 lb

## 2016-06-06 DIAGNOSIS — C61 Malignant neoplasm of prostate: Secondary | ICD-10-CM

## 2016-06-06 NOTE — Progress Notes (Deleted)
Dwayne Boone 61 y.o. 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      Pain: He is currently having right side pain 4/10 taking Oxycodone.    URINARY: He denies  urinary frequency,urinary urgency or hematuria.  Reports he is emptying his bladder. Pt states he gets up to urinate 1-2   times per night. BOWEL: He reports a  bowel movement everyday/everyother day  diarrhea, normal bowel movements Fatigue:Denies having fatigue. Appetite: Good eating three meals a day. Weight: Wt Readings from Last 3 Encounters:  06/06/16 132 lb 12.8 oz (60.2 kg)  05/14/16 132 lb 14.4 oz (60.3 kg)  04/23/16 132 lb 12.8 oz (60.2 kg)   Urologist:Dr.Budzyn Lupron injection every 6 months: Last  dose   Next  August 2018                                                           PSA 9 ng/ml BP 136/80   Pulse (!) 110   Temp 97.9 F (36.6 C) (Oral)   Resp 18   Ht 5\' 11"  (1.803 m)   Wt 132 lb 12.8 oz (60.2 kg)   SpO2 100%   BMI 18.52 kg/m

## 2016-06-06 NOTE — Progress Notes (Addendum)
Dwayne Boone 61 y.o. 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      Pain: He is currently having right side pain 4/10 taking Oxycodone.    URINARY: He denies  urinary frequency,urinary urgency or hematuria.  Reports he is emptying his bladder. Pt states he gets up to urinate 1-2   times per night. BOWEL: He reports a  bowel movement everyday 3-4 days, taking miralax, no diarrhea. Fatigue:Denies having fatigue. Appetite: Good eating three meals a day. Weight: Wt Readings from Last 3 Encounters:  06/06/16 132 lb 12.8 oz (60.2 kg)  05/14/16 132 lb 14.4 oz (60.3 kg)  04/23/16 132 lb 12.8 oz (60.2 kg)   Urologist:Dr.Budzyn Lupron injection every 6 months: Last  dose   Next  August 2018                                                           PSA 9 ng/ml BP 136/80   Pulse (!) 110   Temp 97.9 F (36.6 C) (Oral)   Resp 18   Ht 5\' 11"  (1.803 m)   Wt 132 lb 12.8 oz (60.2 kg)   SpO2 100%   BMI 18.52 kg/m

## 2016-06-06 NOTE — Progress Notes (Deleted)
BP 136/80   Pulse (!) 110   Temp 97.9 F (36.6 C) (Oral)   Resp 18   Ht 5\' 11"  (1.803 m)   Wt 132 lb 12.8 oz (60.2 kg)   SpO2 100%   BMI 18.52 kg/m

## 2016-06-12 ENCOUNTER — Telehealth: Payer: Self-pay | Admitting: *Deleted

## 2016-06-12 ENCOUNTER — Other Ambulatory Visit: Payer: Self-pay | Admitting: *Deleted

## 2016-06-12 ENCOUNTER — Other Ambulatory Visit: Payer: Self-pay | Admitting: Oncology

## 2016-06-12 ENCOUNTER — Encounter: Payer: Self-pay | Admitting: Radiation Oncology

## 2016-06-12 DIAGNOSIS — C61 Malignant neoplasm of prostate: Secondary | ICD-10-CM

## 2016-06-12 MED ORDER — OXYCODONE HCL 5 MG PO TABS: ORAL_TABLET | ORAL | 0 refills | Status: DC

## 2016-06-12 NOTE — Telephone Encounter (Signed)
called patient and informed him that his prescription is ready for pickup. Also, Dr. Alen Blew is referring you back to radiation oncology for the right sided hip pain. Patient verbalized understanding.

## 2016-06-12 NOTE — Telephone Encounter (Signed)
Pt called reporting pain on Right hip pain.  Spoke with pt and was informed that pt had Left hip pain before - received radiation with relief of pain.  Now pt developed pain in Right side upper hip area since last week.  The pain worsens make walking very difficult.  Takes Oxycodone 5 mg every 4 hours as needed Without relief. Stated bowel and bladder function fine.  Pt would like to know what Dr. Alen Blew would recommend. Pt's  Phone    [phone].

## 2016-06-13 ENCOUNTER — Telehealth: Payer: Self-pay | Admitting: *Deleted

## 2016-06-13 NOTE — Telephone Encounter (Signed)
Voicemail retrieved requesting if it is okay for room mate to pick up my prescription.   Returned call.  Yes with picture ID and must sign for the prescription.

## 2016-06-14 ENCOUNTER — Ambulatory Visit
Admission: RE | Admit: 2016-06-14 | Discharge: 2016-06-14 | Disposition: A | Discharge status: Home / Self Care | Payer: Medicare Other | Source: Ambulatory Visit | Attending: Radiation Oncology | Admitting: Radiation Oncology

## 2016-06-14 DIAGNOSIS — C7951 Secondary malignant neoplasm of bone: Secondary | ICD-10-CM | POA: Diagnosis not present

## 2016-06-14 DIAGNOSIS — Z510 Encounter for antineoplastic radiation therapy: Secondary | ICD-10-CM | POA: Diagnosis not present

## 2016-06-14 DIAGNOSIS — C61 Malignant neoplasm of prostate: Secondary | ICD-10-CM | POA: Insufficient documentation

## 2016-06-14 NOTE — Progress Notes (Signed)
Radiation Oncology         [phone] ________________________________  Name: FATIH STALVEY MRN: [id number]  Date: 06/06/2016  DOB: 05-Apr-1956  Post Treatment Note  CC: No PCP Per Patient  No ref. provider found  Diagnosis:  61 y.o. 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  He reports that he has recovered well from the effects of radiation. His left hip pain is significantly improved.   However, he does have a new complaint of right hip pain which has been progressively worsening over the last 3-4 weeks.                     On review of systems, the patient states that he has new onset of right hip pain which has been progressively worsening over the last 3-4 weeks. He rates the pain 4 out of 10 on the pain scale. He is currently using oxycodone as needed for pain and reports that this does provide some temporary relief. He has not had any recent injury. The pain radiates into the right upper thigh but does not go into the buttocks or down to the foot.  He denies numbness or tingling in the RLE.                        ALLERGIES:  has No Known Allergies.  Meds: Current Outpatient Prescriptions  Medication Sig Dispense Refill  . emtricitabine-rilpivir-tenofovir AF (ODEFSEY) 200-25-25 MG TABS tablet Take 1 tablet by mouth daily with breakfast. 30 tablet 11  . finasteride (PROSCAR) 5 MG tablet Take 5 mg by mouth daily.  11  . lidocaine-prilocaine (EMLA) cream Apply 1 application topically as needed. 30 g 0  . oxyCODONE (OXY IR/ROXICODONE) 5 MG immediate release tablet Take 1-2 tablets every four hours as needed for severe pain. 60 tablet 0  . pravastatin (PRAVACHOL) 40 MG tablet TAKE 1 TABLET(40 MG TOTAL) BY MOUTH DAILY 30 tablet  3  . prochlorperazine (COMPAZINE) 10 MG tablet Take 1 tablet (10 mg total) by mouth every 6 (six) hours as needed for nausea or vomiting. (Patient not taking: Reported on 06/06/2016) 30 tablet 0  . tamsulosin (FLOMAX) 0.4 MG CAPS capsule Take 1 capsule (0.4 mg total) by mouth daily after breakfast. 30 capsule 11  . TIVICAY 50 MG tablet TAKE 1 TABLET BY MOUTH DAILY 30 tablet 3   No current facility-administered medications for this visit.     Physical Findings: BP 136/80   Pulse (!) 110   Temp 97.9 F (36.6 C) (Oral)   Resp 18   Ht 5\' 11"  (1.803 m)   Wt 132 lb 12.8 oz (60.2 kg)   SpO2 100%   BMI 18.52 kg/m   Pain: 4/10 in the right hip In general this is a well appearing african [place] male in no acute distress. He's alert and oriented x4 and appropriate throughout the examination. Cardiopulmonary assessment is negative for acute distress and he exhibits normal effort. He has full but painful ROM of the right hip. Sensation is intact bilateral LEs. Strength is 3/5 in the RLE, 5/5 in the LLE.  Lab Findings: Lab Results  Component Value Date   WBC 7.2  05/14/2016   HGB 12.0 (L) 05/14/2016   HCT 37.5 (L) 05/14/2016   MCV 105.6 (H) 05/14/2016   PLT 357 05/14/2016     Radiographic Findings: I personally reviewed his images from most recent MRI Pelvis 03/27/16:   IMPRESSION: 1. 7.4 x 4.4 x 5.6 cm heterogeneously enhancing expansile mass involving the left pubic body and left superior pubic ramus which has significantly enlarged compared with 01/01/2016 consistent with metastatic malignancy. The mass severely narrows the obturator foramen and may result in obturator nerve impingement. 10 mm  low signal lesion in the right and left inferior pubic ramus. 2. Incompletely characterized 2.2 x 4.7 cm mass involving the entire left side of the prostate gland most consistent with prostate cancer. 3. Multiple small sclerotic bone lesions in the pelvis and lower lumbosacral spine  consistent with metastatic prostate cancer. Impression/Plan: 1. 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  He has recovered well from the effects of radiotherapy and had an excellent response to treatment with significant pain relief. He will continue to follow up with Dr. Alen Blew for treatment of his prostate cancer.  We are happy to see him as needed. 2. Right Hip Pain.  After discussion with Dr. Tammi Klippel, the decision is to move forward with CT SIM imaging of the right hip for consideration of palliative radiotherapy to this area to help with pain management. He has been scheduled for [facility] on Friday, March 9th.  Further treatment planning pending those results.    Nicholos Johns, PA-C

## 2016-06-14 NOTE — Progress Notes (Signed)
  Radiation Oncology         [phone]) [phone] ________________________________  Name: Dwayne Boone MRN: [id number]  Date: 06/14/2016  DOB: [birth year]  SIMULATION AND TREATMENT PLANNING NOTE    ICD-9-CM ICD-10-CM   1. Bone metastasis ([facility]) 198.5 C79.51   2. Prostate cancer ([facility]) 185 C61     DIAGNOSIS: 62 y.o. gentleman with Stage IV metastatic adenocarcinoma of the prostate with a Gleason's score of 4+5=9, a PSA of 441.90, and painful metastasis of the right hip  NARRATIVE:  The patient was brought to the [facility].  Identity was confirmed.  All relevant records and images related to the planned course of therapy were reviewed.  The patient freely provided informed written consent to proceed with treatment after reviewing the details related to the planned course of therapy. The consent form was witnessed and verified by the simulation staff.  Then, the patient was set-up in a stable reproducible  supine position for radiation therapy.  CT images were obtained.  Surface markings were placed.  The CT images were loaded into the planning software.  Then the target and avoidance structures were contoured.  Treatment planning then occurred.  The radiation prescription was entered and confirmed.  Then, I designed and supervised the construction of a total of 3 medically necessary complex treatment devices.  I have requested : 3D Simulation  I have requested a DVH of the following structures: bladder, rectum, left femoral neck and target.  PLAN:  The patient will receive 30 Gy in 10 fractions.  ________________________________  Sheral Apley Tammi Klippel, M.D.  This document serves as a record of services personally performed by Tyler Pita, MD. It was created on his behalf by Darcus Austin, a trained medical scribe. The creation of this record is based on the scribe's personal observations and the provider's statements to them. This document has been checked and approved by the attending  provider.

## 2016-06-19 DIAGNOSIS — Z510 Encounter for antineoplastic radiation therapy: Secondary | ICD-10-CM | POA: Diagnosis not present

## 2016-06-19 DIAGNOSIS — C7951 Secondary malignant neoplasm of bone: Secondary | ICD-10-CM | POA: Diagnosis not present

## 2016-06-19 DIAGNOSIS — C61 Malignant neoplasm of prostate: Secondary | ICD-10-CM | POA: Diagnosis not present

## 2016-06-20 ENCOUNTER — Ambulatory Visit
Admission: RE | Admit: 2016-06-20 | Discharge: 2016-06-20 | Disposition: A | Discharge status: Home / Self Care | Payer: Medicare Other | Source: Ambulatory Visit | Attending: Radiation Oncology | Admitting: Radiation Oncology

## 2016-06-20 DIAGNOSIS — C7951 Secondary malignant neoplasm of bone: Secondary | ICD-10-CM

## 2016-06-20 DIAGNOSIS — Z510 Encounter for antineoplastic radiation therapy: Secondary | ICD-10-CM | POA: Diagnosis not present

## 2016-06-20 DIAGNOSIS — C61 Malignant neoplasm of prostate: Secondary | ICD-10-CM | POA: Diagnosis not present

## 2016-06-20 NOTE — Progress Notes (Signed)
  Radiation Oncology         [phone] ________________________________  Name: ROMONE SHAFF MRN: [id number]  Date: 06/20/2016  DOB: 06-21-1955  SIMULATION AND TREATMENT PLANNING NOTE    ICD-9-CM ICD-10-CM   1. Bone metastasis (HCC) 198.5 C79.51     DIAGNOSIS:  61 yo man with painful T-spine and Rt rib metastasis from prostate cancer  NARRATIVE:  The patient was brought to the [facility].  Identity was confirmed.  All relevant records and images related to the planned course of therapy were reviewed.  The patient freely provided informed written consent to proceed with treatment after reviewing the details related to the planned course of therapy. The consent form was witnessed and verified by the simulation staff.  Then, the patient was set-up in a stable reproducible  supine position for radiation therapy.  CT images were obtained.  Surface markings were placed.  The CT images were loaded into the planning software.  Then the target and avoidance structures were contoured.  Treatment planning then occurred.  The radiation prescription was entered and confirmed.  Then, I designed and supervised the construction of a total of 3 medically necessary complex treatment devices.  I have requested : Isodose Plan.    PLAN:  The patient will receive 30 Gy in 10 fractions.  ________________________________  Sheral Apley Tammi Klippel, M.D.

## 2016-06-20 NOTE — Addendum Note (Signed)
Encounter addended by: Heywood Footman, RN on: 06/20/2016  2:15 PM<BR>    Actions taken: Patient Education assessment filed

## 2016-06-21 ENCOUNTER — Encounter: Payer: Self-pay | Admitting: Medical Oncology

## 2016-06-21 ENCOUNTER — Ambulatory Visit
Admission: RE | Admit: 2016-06-21 | Discharge: 2016-06-21 | Disposition: A | Discharge status: Home / Self Care | Payer: Medicare Other | Source: Ambulatory Visit | Attending: Radiation Oncology | Admitting: Radiation Oncology

## 2016-06-21 DIAGNOSIS — C7951 Secondary malignant neoplasm of bone: Secondary | ICD-10-CM | POA: Diagnosis not present

## 2016-06-21 DIAGNOSIS — Z510 Encounter for antineoplastic radiation therapy: Secondary | ICD-10-CM | POA: Diagnosis not present

## 2016-06-21 DIAGNOSIS — C61 Malignant neoplasm of prostate: Secondary | ICD-10-CM | POA: Diagnosis not present

## 2016-06-21 NOTE — Progress Notes (Signed)
I spoke with Mr. Wacha and his friend Audry Pili after radiation today. Ricky expressed his concerns about patient being unstable on his feet and not willing to use walker. Patient is adamant that he is ok and will not fall.  I discussed being weak, unstable due to his illness and  concerns about a fall and how an injury would only be a greater set back for him.  He voiced understanding. I encouraged him to use the walker and when he gets stronger he can always stop. I asked if they have other equipment like a bedside commode and or wheelchair that could make ADL's easier. They do not but will discuss with Dr. Alen Blew next Tuesday during his follow up visit.

## 2016-06-24 ENCOUNTER — Ambulatory Visit
Admission: RE | Admit: 2016-06-24 | Discharge: 2016-06-24 | Disposition: A | Discharge status: Home / Self Care | Payer: Medicare Other | Source: Ambulatory Visit | Attending: Radiation Oncology | Admitting: Radiation Oncology

## 2016-06-24 ENCOUNTER — Ambulatory Visit: Payer: Medicare Other | Admitting: Radiation Oncology

## 2016-06-24 ENCOUNTER — Ambulatory Visit: Payer: Medicare Other

## 2016-06-24 DIAGNOSIS — C61 Malignant neoplasm of prostate: Secondary | ICD-10-CM | POA: Diagnosis not present

## 2016-06-24 DIAGNOSIS — Z510 Encounter for antineoplastic radiation therapy: Secondary | ICD-10-CM | POA: Diagnosis not present

## 2016-06-24 DIAGNOSIS — C7951 Secondary malignant neoplasm of bone: Secondary | ICD-10-CM | POA: Diagnosis not present

## 2016-06-25 ENCOUNTER — Ambulatory Visit (HOSPITAL_BASED_OUTPATIENT_CLINIC_OR_DEPARTMENT_OTHER): Payer: Medicare Other

## 2016-06-25 ENCOUNTER — Other Ambulatory Visit (HOSPITAL_BASED_OUTPATIENT_CLINIC_OR_DEPARTMENT_OTHER): Payer: Medicare Other

## 2016-06-25 ENCOUNTER — Ambulatory Visit
Admission: RE | Admit: 2016-06-25 | Discharge: 2016-06-25 | Disposition: A | Discharge status: Home / Self Care | Payer: Medicare Other | Source: Ambulatory Visit | Attending: Radiation Oncology | Admitting: Radiation Oncology

## 2016-06-25 DIAGNOSIS — C61 Malignant neoplasm of prostate: Secondary | ICD-10-CM | POA: Diagnosis not present

## 2016-06-25 DIAGNOSIS — Z95828 Presence of other vascular implants and grafts: Secondary | ICD-10-CM

## 2016-06-25 DIAGNOSIS — Z510 Encounter for antineoplastic radiation therapy: Secondary | ICD-10-CM | POA: Diagnosis not present

## 2016-06-25 DIAGNOSIS — C7951 Secondary malignant neoplasm of bone: Secondary | ICD-10-CM | POA: Diagnosis not present

## 2016-06-25 LAB — COMPREHENSIVE METABOLIC PANEL
ALT: 9 U/L (ref 0–55)
AST: 15 U/L (ref 5–34)
Albumin: 3.5 g/dL (ref 3.5–5.0)
Alkaline Phosphatase: 113 U/L (ref 40–150)
Anion Gap: 10 mEq/L (ref 3–11)
BILIRUBIN TOTAL: 0.3 mg/dL (ref 0.20–1.20)
BUN: 26.7 mg/dL — AB (ref 7.0–26.0)
CO2: 26 meq/L (ref 22–29)
Calcium: 10.5 mg/dL — ABNORMAL HIGH (ref 8.4–10.4)
Chloride: 105 mEq/L (ref 98–109)
Creatinine: 0.8 mg/dL (ref 0.7–1.3)
GLUCOSE: 92 mg/dL (ref 70–140)
POTASSIUM: 4.2 meq/L (ref 3.5–5.1)
SODIUM: 142 meq/L (ref 136–145)
TOTAL PROTEIN: 7.2 g/dL (ref 6.4–8.3)

## 2016-06-25 LAB — CBC WITH DIFFERENTIAL/PLATELET
BASO%: 0.7 % (ref 0.0–2.0)
Basophils Absolute: 0 10*3/uL (ref 0.0–0.1)
EOS ABS: 0 10*3/uL (ref 0.0–0.5)
EOS%: 0.6 % (ref 0.0–7.0)
HCT: 34.7 % — ABNORMAL LOW (ref 38.4–49.9)
HGB: 11.7 g/dL — ABNORMAL LOW (ref 13.0–17.1)
LYMPH%: 17.9 % (ref 14.0–49.0)
MCH: 33.9 pg — ABNORMAL HIGH (ref 27.2–33.4)
MCHC: 33.7 g/dL (ref 32.0–36.0)
MCV: 100.7 fL — ABNORMAL HIGH (ref 79.3–98.0)
MONO#: 0.3 10*3/uL (ref 0.1–0.9)
MONO%: 9 % (ref 0.0–14.0)
NEUT%: 71.8 % (ref 39.0–75.0)
NEUTROS ABS: 2.4 10*3/uL (ref 1.5–6.5)
Platelets: 313 10*3/uL (ref 140–400)
RBC: 3.45 10*6/uL — AB (ref 4.20–5.82)
RDW: 15 % — ABNORMAL HIGH (ref 11.0–14.6)
WBC: 3.4 10*3/uL — ABNORMAL LOW (ref 4.0–10.3)
lymph#: 0.6 10*3/uL — ABNORMAL LOW (ref 0.9–3.3)

## 2016-06-25 MED ORDER — SODIUM CHLORIDE 0.9% FLUSH
10.0000 mL | INTRAVENOUS | Status: DC | PRN
  Administered 2016-06-25: 10 mL via INTRAVENOUS
  Filled 2016-06-25: qty 10

## 2016-06-25 MED ORDER — HEPARIN SOD (PORK) LOCK FLUSH 100 UNIT/ML IV SOLN
500.0000 [IU] | Freq: Once | INTRAVENOUS | Status: AC | PRN
  Administered 2016-06-25: 500 [IU] via INTRAVENOUS
  Filled 2016-06-25: qty 5

## 2016-06-26 ENCOUNTER — Telehealth: Payer: Self-pay | Admitting: *Deleted

## 2016-06-26 ENCOUNTER — Ambulatory Visit
Admission: RE | Admit: 2016-06-26 | Discharge: 2016-06-26 | Disposition: A | Discharge status: Home / Self Care | Payer: Medicare Other | Source: Ambulatory Visit | Attending: Radiation Oncology | Admitting: Radiation Oncology

## 2016-06-26 ENCOUNTER — Encounter: Payer: Self-pay | Admitting: *Deleted

## 2016-06-26 DIAGNOSIS — Z510 Encounter for antineoplastic radiation therapy: Secondary | ICD-10-CM | POA: Diagnosis not present

## 2016-06-26 DIAGNOSIS — C61 Malignant neoplasm of prostate: Secondary | ICD-10-CM | POA: Diagnosis not present

## 2016-06-26 DIAGNOSIS — C7951 Secondary malignant neoplasm of bone: Secondary | ICD-10-CM | POA: Diagnosis not present

## 2016-06-26 LAB — PSA: Prostate Specific Ag, Serum: 10.9 ng/mL — ABNORMAL HIGH (ref 0.0–4.0)

## 2016-06-26 NOTE — Progress Notes (Signed)
[place] Work  Clinical Social Work was referred by radiation oncology RN to reach out to patient's friend/caregiver, Optician, dispensing.  CSW left voicemail for Audry Pili to return call.  Maryjean Morn, MSW, LCSW, OSW-C Clinical Social Worker [facility] [phone]

## 2016-06-26 NOTE — Telephone Encounter (Signed)
"  Calling for my roommate.  Having pain issue problems.  He will run out of the oxycodone tomorrow morning."   Call transferred to collaborative line [phone].

## 2016-06-27 ENCOUNTER — Ambulatory Visit (HOSPITAL_BASED_OUTPATIENT_CLINIC_OR_DEPARTMENT_OTHER): Payer: Medicare Other | Admitting: Oncology

## 2016-06-27 ENCOUNTER — Ambulatory Visit
Admission: RE | Admit: 2016-06-27 | Discharge: 2016-06-27 | Disposition: A | Discharge status: Home / Self Care | Payer: Medicare Other | Source: Ambulatory Visit | Attending: Radiation Oncology | Admitting: Radiation Oncology

## 2016-06-27 VITALS — BP 88/59 | HR 112 | Temp 98.0°F | Resp 16 | Ht 71.0 in

## 2016-06-27 DIAGNOSIS — C61 Malignant neoplasm of prostate: Secondary | ICD-10-CM

## 2016-06-27 DIAGNOSIS — C7951 Secondary malignant neoplasm of bone: Secondary | ICD-10-CM

## 2016-06-27 DIAGNOSIS — B20 Human immunodeficiency virus [HIV] disease: Secondary | ICD-10-CM

## 2016-06-27 DIAGNOSIS — Z510 Encounter for antineoplastic radiation therapy: Secondary | ICD-10-CM | POA: Diagnosis not present

## 2016-06-27 MED ORDER — FENTANYL 25 MCG/HR TD PT72: 25.0000 ug | MEDICATED_PATCH | TRANSDERMAL | 0 refills | Status: DC

## 2016-06-27 MED ORDER — OXYCODONE HCL 5 MG PO TABS: ORAL_TABLET | ORAL | 0 refills | Status: DC

## 2016-06-27 NOTE — Progress Notes (Signed)
Hematology and Oncology Follow Up Visit  Dwayne Boone [id number] [birth year] 61 y.o. 06/27/2016 4:32 PM No PCP Per PatientNo ref. provider found   Principle Diagnosis: 61 year old with prostate cancer diagnosed in 2017. He presented with a PSA of 451 and a Gleason score 4+5 = 9. He has high volume disease with all 12 cores of his prostate involved with high-grade cancer. His staging workup showed metastatic disease including retroperitoneal adenopathy as well as diffuse bony metastasis   Prior Therapy: He underwent a prostate biopsy on 12/01/2015. The biopsy showed prostate adenocarcinoma with high-volume disease in all 12 cores. His Gleason score was 4+5 = 9 and the majority of the cores with few cores that had 4+4 = 8. Palliative radiation therapy to the left hip. Last fraction scheduled on 04/24/2016. Taxotere chemotherapy at 75 mg/m started on 01/30/2016. He completed 6 cycles of chemotherapy on 05/14/2016.  Current therapy:  Androgen deprivation therapy under the care of Dr.Budzyn at [facility].  Palliative radiation therapy to the thoracic spine and ribs undergoing treatment at this moment.    Interim History: Dwayne Boone presents today for a follow-up visit with his partner. Since the last visit, he is experience continuous decline in his overall health and performance status. He is generally weak and limited in his mobility and requiring assistance for most activities of daily living. His appetite has been declining and appears to be losing weight. He does not have any specific neurological symptoms and his complaints appear to be generalized.  His pain continues to be a problem and has been diffuse in nature. He is reporting symptoms of shoulder pain, mid thoracic pain, hip pain and lower extremity pain. He has been using oxycodone which has not controlled his pain adequately.  He denied any headaches, blurry vision, syncope or seizures. He denied any falls or seizures. He  denied any loss of bowel or bladder function. He does not report any fevers, chills or sweats. He does not report any chest pain, palpitation, orthopnea or leg edema. He does not report any cough, wheezing or hemoptysis. He does not report any nausea, vomiting, abdominal pain but does report constipation. He does not report any hematuria or dysuria. Remaining review of systems unremarkable.   Medications: I have reviewed the patient's current medications.  Current Outpatient Prescriptions  Medication Sig Dispense Refill  . emtricitabine-rilpivir-tenofovir AF (ODEFSEY) 200-25-25 MG TABS tablet Take 1 tablet by mouth daily with breakfast. 30 tablet 11  . finasteride (PROSCAR) 5 MG tablet Take 5 mg by mouth daily.  11  . lidocaine-prilocaine (EMLA) cream Apply 1 application topically as needed. 30 g 0  . oxyCODONE (OXY IR/ROXICODONE) 5 MG immediate release tablet Take 1-2 tablets every four hours as needed for severe pain. 60 tablet 0  . pravastatin (PRAVACHOL) 40 MG tablet TAKE 1 TABLET(40 MG TOTAL) BY MOUTH DAILY 30 tablet 3  . tamsulosin (FLOMAX) 0.4 MG CAPS capsule Take 1 capsule (0.4 mg total) by mouth daily after breakfast. 30 capsule 11  . TIVICAY 50 MG tablet TAKE 1 TABLET BY MOUTH DAILY 30 tablet 3  . fentaNYL (DURAGESIC - DOSED MCG/HR) 25 MCG/HR patch Place 1 patch (25 mcg total) onto the skin every 3 (three) days. 5 patch 0  . prochlorperazine (COMPAZINE) 10 MG tablet Take 1 tablet (10 mg total) by mouth every 6 (six) hours as needed for nausea or vomiting. (Patient not taking: Reported on 06/06/2016) 30 tablet 0   No current facility-administered medications for this visit.  Allergies: No Known Allergies  Past Medical History, Surgical history, Social history, and Family History were reviewed and updated.   Physical Exam: Blood pressure (!) 88/59, pulse (!) 112, temperature 98 F (36.7 C), temperature source Oral, resp. rate 16, height 5\' 11"  (1.803 m), SpO2 100 %. ECOG:  2 General appearance: Chronically ill appearing gentleman appeared in little distress. Head: Normocephalic, without obvious abnormality no oral ulcers or lesions. Neck: no adenopathy Lymph nodes: Cervical, supraclavicular, and axillary nodes normal. Heart:regular rate and rhythm, S1, S2 normal, no murmur, click, rub or gallop Lung:chest clear, no wheezing, rales, normal symmetric air entry Abdomin: soft, non-tender, without masses or organomegaly no shifting dullness or ascites. EXT:no erythema, induration, or nodules.  He had good range of motion in his left hip. No point tenderness.  Neurological examination: No deficits noted today.  Lab Results: Lab Results  Component Value Date   WBC 3.4 (L) 06/25/2016   HGB 11.7 (L) 06/25/2016   HCT 34.7 (L) 06/25/2016   MCV 100.7 (H) 06/25/2016   PLT 313 06/25/2016     Chemistry      Component Value Date/Time   NA 142 06/25/2016 1400   K 4.2 06/25/2016 1400   CL 107 01/25/2016 0810   CO2 26 06/25/2016 1400   BUN 26.7 (H) 06/25/2016 1400   CREATININE 0.8 06/25/2016 1400      Component Value Date/Time   CALCIUM 10.5 (H) 06/25/2016 1400   ALKPHOS 113 06/25/2016 1400   AST 15 06/25/2016 1400   ALT 9 06/25/2016 1400   BILITOT 0.30 06/25/2016 1400       Results for Dwayne Boone, Dwayne Boone (MRN [id number]) as of 06/27/2016 16:33  Ref. Range 10/19/2015 14:57 01/30/2016 09:34 02/20/2016 07:48 03/12/2016 07:48 04/02/2016 08:09 04/23/2016 09:09 05/14/2016 09:33 06/25/2016 14:00  PSA Latest Ref Range: 0.0 - 4.0 ng/mL 441.90 (H) 39.7 (H) 17.3 (H) 9.8 (H) 8.7 (H) 7.4 (H) 10.0 (H) 10.9 (H)       Impression and Plan:  61 year old gentleman with the following issues:  1. Prostate cancer diagnosed in August 2017. He presented with a PSA of 451 and a Gleason score 4+5 = 9. He has high volume disease with all 12 cores of his prostate involved with high-grade cancer. His staging workup showed metastatic disease including retroperitoneal adenopathy as well as  diffuse bony metastasis. He has been started on androgen deprivation with Lupron.  He is Status post Taxotere chemotherapy which she completed in February 2018.  His PSA obtained on 06/25/2016 was not dramatically different from his previous counts. His PSA continues to be close to 10 which a dramatic decrease from his initial diagnosis.  Despite his PSA stability, he has been clinically declining. He has poor performance status and generalized weakness. Etiology remains unclear but could be related to his cancer treatment or rapidly progressive malignancy that is not producing PSA.  He is currently receiving palliative radiation therapy to thoracic spine and ribs which I encouraged that he continues. Additional therapy for his cancer would be challenging given his rapid decline and poor performance status. I explained to him that if he does not have any improvement in his performance status, he is likely heading towards hospice enrollment.  His partner voiced concerns about his decline in his overall health and both of them would consider hospice enrollment in the future. They are also eliciting family help in assisting and caring for him. I certainly encouraged him to do so given the fact that it is very likely he'll  continue to decline and will require more assistance.  I have kept the possibility of starting anticancer treatment with Gillermina Phy or Zytiga once he completes radiation in the near future and if his clinical status stabilizes.  2. IV access: Port-A-Cath will be flushed every 6 weeks.   3. Androgen deprivation: He continues to receive Lupron and will continue indefinitely.  4. Bone directed therapy: This is deferred for the time being.  7. HIV: He is followed by Dr.Van Dam. No major changes from that standpoint.  8. Diffuse pain: I recommended long-acting pain medication in the form of a fentanyl patch. I recommending starting at 25 g in our every 72 hours and use oxycodone for  breakthrough pain medication. This this can be titrated higher if needed to.  9. Follow-up: Will be in 4-6 weeks to follow his progress.      Zola Button, MD 3/22/20184:32 PM

## 2016-06-28 ENCOUNTER — Telehealth: Payer: Self-pay | Admitting: *Deleted

## 2016-06-28 ENCOUNTER — Ambulatory Visit
Admission: RE | Admit: 2016-06-28 | Discharge: 2016-06-28 | Disposition: A | Discharge status: Home / Self Care | Payer: Medicare Other | Source: Ambulatory Visit | Attending: Radiation Oncology | Admitting: Radiation Oncology

## 2016-06-28 DIAGNOSIS — Z510 Encounter for antineoplastic radiation therapy: Secondary | ICD-10-CM | POA: Diagnosis not present

## 2016-06-28 DIAGNOSIS — C61 Malignant neoplasm of prostate: Secondary | ICD-10-CM | POA: Diagnosis not present

## 2016-06-28 DIAGNOSIS — C7951 Secondary malignant neoplasm of bone: Secondary | ICD-10-CM | POA: Diagnosis not present

## 2016-06-28 NOTE — Telephone Encounter (Signed)
-----   Message from Truman Hayward, MD sent at 06/27/2016  7:39 PM EDT ----- Thanks so much this is very sad to hear re Dwayne Boone. I hope he turns the corner. As far as hospice I do not know why his partner would also seek hospice as he does not to my knowledge have a new terminal diagnosis. [facility] can you reach out to Dwayne Boone and his partner. Perhaps Dwayne Boone and partner would benefit from seeing Dwayne Boone? ----- Message ----- From: Wyatt Portela, MD Sent: 06/27/2016   4:45 PM To: Truman Hayward, MD  Unfortunately he is declining fast. I have recommended hospice if his condition continues to decline. Just FYI.

## 2016-06-28 NOTE — Telephone Encounter (Signed)
Left a message for Youce[facility] letting him know that Dr Tommy Medal wanted to reach out to them and see if they needed anything, offered an appointment with Grayland Ormond.  RN asked them to call back and let us know if we can do anything. Landis Gandy, RN

## 2016-07-01 ENCOUNTER — Ambulatory Visit
Admission: RE | Admit: 2016-07-01 | Discharge: 2016-07-01 | Disposition: A | Discharge status: Home / Self Care | Payer: Medicare Other | Source: Ambulatory Visit | Attending: Radiation Oncology | Admitting: Radiation Oncology

## 2016-07-01 DIAGNOSIS — C61 Malignant neoplasm of prostate: Secondary | ICD-10-CM | POA: Diagnosis not present

## 2016-07-01 DIAGNOSIS — Z510 Encounter for antineoplastic radiation therapy: Secondary | ICD-10-CM | POA: Diagnosis not present

## 2016-07-01 DIAGNOSIS — C7951 Secondary malignant neoplasm of bone: Secondary | ICD-10-CM | POA: Diagnosis not present

## 2016-07-01 NOTE — Telephone Encounter (Signed)
Thanks Michelle

## 2016-07-02 ENCOUNTER — Telehealth: Payer: Self-pay | Admitting: Radiation Oncology

## 2016-07-02 ENCOUNTER — Ambulatory Visit: Payer: Medicare Other

## 2016-07-02 NOTE — Telephone Encounter (Signed)
Received message from therapist, Merrilee Seashore, that the patient cancelled radiation treatment for today. Phoned patient at home. Patient reports his diarrhea is under control now with the aid of Imodium. He reports feeling better now than he did this morning. He confirms that he intends to present for radiation tomorrow. Patient denies any additional needs at this time but, expressed appreciation for the call.

## 2016-07-03 ENCOUNTER — Ambulatory Visit
Admission: RE | Admit: 2016-07-03 | Discharge: 2016-07-03 | Disposition: A | Discharge status: Home / Self Care | Payer: Medicare Other | Source: Ambulatory Visit | Attending: Radiation Oncology | Admitting: Radiation Oncology

## 2016-07-03 ENCOUNTER — Ambulatory Visit: Payer: Medicare Other

## 2016-07-03 DIAGNOSIS — C61 Malignant neoplasm of prostate: Secondary | ICD-10-CM | POA: Diagnosis not present

## 2016-07-03 DIAGNOSIS — Z510 Encounter for antineoplastic radiation therapy: Secondary | ICD-10-CM | POA: Diagnosis not present

## 2016-07-03 DIAGNOSIS — C7951 Secondary malignant neoplasm of bone: Secondary | ICD-10-CM | POA: Diagnosis not present

## 2016-07-04 ENCOUNTER — Ambulatory Visit
Admission: RE | Admit: 2016-07-04 | Discharge: 2016-07-04 | Disposition: A | Discharge status: Home / Self Care | Payer: Medicare Other | Source: Ambulatory Visit | Attending: Radiation Oncology | Admitting: Radiation Oncology

## 2016-07-04 ENCOUNTER — Ambulatory Visit: Payer: Medicare Other

## 2016-07-04 DIAGNOSIS — C61 Malignant neoplasm of prostate: Secondary | ICD-10-CM | POA: Diagnosis not present

## 2016-07-04 DIAGNOSIS — C7951 Secondary malignant neoplasm of bone: Secondary | ICD-10-CM | POA: Diagnosis not present

## 2016-07-04 DIAGNOSIS — Z510 Encounter for antineoplastic radiation therapy: Secondary | ICD-10-CM | POA: Diagnosis not present

## 2016-07-05 ENCOUNTER — Ambulatory Visit: Payer: Medicare Other

## 2016-07-05 ENCOUNTER — Encounter: Payer: Self-pay | Admitting: *Deleted

## 2016-07-05 ENCOUNTER — Ambulatory Visit
Admission: RE | Admit: 2016-07-05 | Discharge: 2016-07-05 | Disposition: A | Discharge status: Home / Self Care | Payer: Medicare Other | Source: Ambulatory Visit | Attending: Radiation Oncology | Admitting: Radiation Oncology

## 2016-07-05 ENCOUNTER — Telehealth: Payer: Self-pay | Admitting: Oncology

## 2016-07-05 DIAGNOSIS — Z510 Encounter for antineoplastic radiation therapy: Secondary | ICD-10-CM | POA: Diagnosis not present

## 2016-07-05 DIAGNOSIS — C61 Malignant neoplasm of prostate: Secondary | ICD-10-CM | POA: Diagnosis not present

## 2016-07-05 DIAGNOSIS — C7951 Secondary malignant neoplasm of bone: Secondary | ICD-10-CM | POA: Diagnosis not present

## 2016-07-05 NOTE — Progress Notes (Signed)
[place] Work  Clinical Social Work was referred by radiation oncology RN for assessment of psychosocial needs.  Clinical Social Worker met with patient and patient's significant other, Audry Pili, to offer support and assess for needs.  Audry Pili reported he is feeling overwhelmed and Mr. Hargadon reported he is concerned regarding his finances.  CSW and patient/significant other explored options such as applying for Medicaid and financial assistance programs such as [employer].  CSW introduced patient/significant other to [facility], financial advocate, and Ms. Rochelle met with patient after CSW visit.  CSW provided brief emotional support.  Patient shared minimal information, but did affirm that additional support would be helpful.  Patient is open to Hospice supportive care if there are no other radiation/chemo options that would be beneficial at this time.  CSW encouraged patient to discuss with Dr. Alen Blew to further explore options.  Clinical Social Work and patient also reviewed and completed healthcare advance directives.  The patient designated Deidre Ala, significant other, as their primary healthcare agent and Quint Chestnut, brother, as their secondary agent.  Patient also completed healthcare living will.    Clinical Social Worker notarized documents and made copies for patient/family. Clinical Social Worker will send documents to medical records to be scanned into patient's chart. Clinical Social Worker encouraged patient/family to contact with any additional questions or concerns.  Maryjean Morn, MSW, LCSW Clinical Social Worker [facility] [phone]

## 2016-07-05 NOTE — Telephone Encounter (Signed)
r/s appt per patient. Patient unable to make evening appts. Gave patient new calender.

## 2016-07-08 ENCOUNTER — Ambulatory Visit
Admission: RE | Admit: 2016-07-08 | Discharge: 2016-07-08 | Disposition: A | Discharge status: Home / Self Care | Payer: Medicare Other | Source: Ambulatory Visit | Attending: Radiation Oncology | Admitting: Radiation Oncology

## 2016-07-08 ENCOUNTER — Encounter: Payer: Self-pay | Admitting: Radiation Oncology

## 2016-07-08 ENCOUNTER — Telehealth: Payer: Self-pay | Admitting: *Deleted

## 2016-07-08 DIAGNOSIS — Z510 Encounter for antineoplastic radiation therapy: Secondary | ICD-10-CM | POA: Diagnosis not present

## 2016-07-08 DIAGNOSIS — C7951 Secondary malignant neoplasm of bone: Secondary | ICD-10-CM | POA: Diagnosis not present

## 2016-07-08 DIAGNOSIS — C61 Malignant neoplasm of prostate: Secondary | ICD-10-CM | POA: Diagnosis not present

## 2016-07-08 NOTE — Telephone Encounter (Signed)
Patient's brother walked into the lobby and dropped off FMLA paperwork and gave it to the CNA. The CNA brought the paperwork to the nurse. The nurse placed in box for pick up.

## 2016-07-08 NOTE — Progress Notes (Signed)
The patient was seen briefly today. He finished his course of palliative treatment to the rib and hip today. He was seen in his under treatment block last week, and had two fractions into a third block. He has been doing well and feels as though his pain has improved, and his last treatment finished today. He will follow up with Dr. Alen Blew and return in 1 month for post treatment follow up.    Carola Rhine, PAC

## 2016-07-09 ENCOUNTER — Encounter: Payer: Self-pay | Admitting: *Deleted

## 2016-07-09 NOTE — Progress Notes (Signed)
Completed FMLA paperwork and signature by MD Alen Blew. Informed patient that FMLA papers can be picked up at [facility]. Patient verbalized understanding.

## 2016-07-10 ENCOUNTER — Telehealth: Payer: Self-pay | Admitting: Oncology

## 2016-07-10 ENCOUNTER — Other Ambulatory Visit: Payer: Self-pay | Admitting: *Deleted

## 2016-07-10 DIAGNOSIS — C61 Malignant neoplasm of prostate: Secondary | ICD-10-CM

## 2016-07-10 MED ORDER — OXYCODONE HCL 5 MG PO TABS: ORAL_TABLET | ORAL | 0 refills | Status: AC

## 2016-07-10 MED ORDER — FENTANYL 25 MCG/HR TD PT72: 25.0000 ug | MEDICATED_PATCH | TRANSDERMAL | 0 refills | Status: AC

## 2016-07-10 NOTE — Telephone Encounter (Signed)
Spoke with patient and confirmed his appointments for 5/3

## 2016-07-10 NOTE — Telephone Encounter (Signed)
Patient son called to get a pain medicine refill and would like to pick it up on his break.  Please call when ready.  [phone]

## 2016-07-18 ENCOUNTER — Telehealth: Payer: Self-pay | Admitting: *Deleted

## 2016-07-18 DIAGNOSIS — C7951 Secondary malignant neoplasm of bone: Secondary | ICD-10-CM | POA: Diagnosis not present

## 2016-07-18 DIAGNOSIS — R339 Retention of urine, unspecified: Secondary | ICD-10-CM | POA: Diagnosis not present

## 2016-07-18 DIAGNOSIS — R630 Anorexia: Secondary | ICD-10-CM | POA: Diagnosis not present

## 2016-07-18 DIAGNOSIS — Z21 Asymptomatic human immunodeficiency virus [HIV] infection status: Secondary | ICD-10-CM | POA: Diagnosis not present

## 2016-07-18 DIAGNOSIS — C61 Malignant neoplasm of prostate: Secondary | ICD-10-CM | POA: Diagnosis not present

## 2016-07-18 DIAGNOSIS — C772 Secondary and unspecified malignant neoplasm of intra-abdominal lymph nodes: Secondary | ICD-10-CM | POA: Diagnosis not present

## 2016-07-18 DIAGNOSIS — E785 Hyperlipidemia, unspecified: Secondary | ICD-10-CM | POA: Diagnosis not present

## 2016-07-18 DIAGNOSIS — G893 Neoplasm related pain (acute) (chronic): Secondary | ICD-10-CM | POA: Diagnosis not present

## 2016-07-18 NOTE — Telephone Encounter (Signed)
Patient's caregiver, ricky quick calling to say patient is having increased pain in pelvic area. Per dr Alen Blew he may increase fentanyl to 50 mcg/hr. Referral made to hospice, per patient 's request. Dr Alen Blew to be the attending, hospice physicians may do symptom management and have DNR signed.

## 2016-07-18 NOTE — Progress Notes (Signed)
  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 The Right hip was treated to 30 Gy in 10 fractions at 3 Gy per fraction.  2. The Right chest rib was treated to 30 Gy in 10 fractions at 3 Gy per fraction.   Beams/energy:    1. Isodose plan // 15X, 6X 2. Isodose plan // 10X, 6X  Narrative: The patient tolerated radiation treatment relatively well.   He feels as though his pain has improved with treatment. Reports right hip, low back and right shoulder blade pain 6 on a scale of 0-10. Patient was prescribed Fentanyl to use in addition to oxycodone.  Reports pain is worse with movement. Reports new onset of pain in right arm when attempting to pick up objects such as his coffee cup.  Plan: The patient has completed radiation treatment. The patient will return to radiation oncology clinic for routine followup in one month. I advised him to call or return sooner if he has any questions or concerns related to his recovery or treatment. ________________________________  Sheral Apley. Tammi Klippel, M.D.   This document serves as a record of services personally performed by Tyler Pita, MD. It was created on his behalf by Arlyce Harman, a trained medical scribe. The creation of this record is based on the scribe's personal observations and the provider's statements to them. This document has been checked and approved by the attending provider.

## 2016-07-18 NOTE — Telephone Encounter (Signed)
He can increase to 50 mcg/hr. He can place two patches till he runs out and we can Rx 50 mcg/hr patches then.  I offered him hospice last visit and I think that's what he needs now.

## 2016-07-18 NOTE — Telephone Encounter (Signed)
Caregiver ricky quick calling to say patient c/o increased pain in groin area that fentanyl 25 mcg not helping, even with the break-thru oxycodone 5 mg. Would dr Alen Blew be willing to increase the fentanyl patch? Also states urine is extremely dark brown. No fever. Suggested he increase hydtration and call his urologist.

## 2016-07-19 ENCOUNTER — Telehealth: Payer: Self-pay

## 2016-07-19 DIAGNOSIS — C772 Secondary and unspecified malignant neoplasm of intra-abdominal lymph nodes: Secondary | ICD-10-CM | POA: Diagnosis not present

## 2016-07-19 DIAGNOSIS — C7951 Secondary malignant neoplasm of bone: Secondary | ICD-10-CM | POA: Diagnosis not present

## 2016-07-19 DIAGNOSIS — C61 Malignant neoplasm of prostate: Secondary | ICD-10-CM | POA: Diagnosis not present

## 2016-07-19 DIAGNOSIS — R339 Retention of urine, unspecified: Secondary | ICD-10-CM | POA: Diagnosis not present

## 2016-07-19 DIAGNOSIS — G893 Neoplasm related pain (acute) (chronic): Secondary | ICD-10-CM | POA: Diagnosis not present

## 2016-07-19 DIAGNOSIS — R630 Anorexia: Secondary | ICD-10-CM | POA: Diagnosis not present

## 2016-07-19 NOTE — Telephone Encounter (Signed)
Dwayne Boone is calling to inform [facility] Dwayne Boone is now in care with Hospice.   He would like to have his HIV labs drawn by hospice nurse.     Hospice RN, [name] fax number.  I will fax lab orders to Hospice.   [name], RN

## 2016-07-23 ENCOUNTER — Encounter: Payer: Self-pay | Admitting: *Deleted

## 2016-07-23 ENCOUNTER — Telehealth: Payer: Self-pay | Admitting: *Deleted

## 2016-07-23 DIAGNOSIS — R339 Retention of urine, unspecified: Secondary | ICD-10-CM | POA: Diagnosis not present

## 2016-07-23 DIAGNOSIS — C61 Malignant neoplasm of prostate: Secondary | ICD-10-CM | POA: Diagnosis not present

## 2016-07-23 DIAGNOSIS — R630 Anorexia: Secondary | ICD-10-CM | POA: Diagnosis not present

## 2016-07-23 DIAGNOSIS — C772 Secondary and unspecified malignant neoplasm of intra-abdominal lymph nodes: Secondary | ICD-10-CM | POA: Diagnosis not present

## 2016-07-23 DIAGNOSIS — G893 Neoplasm related pain (acute) (chronic): Secondary | ICD-10-CM | POA: Diagnosis not present

## 2016-07-23 DIAGNOSIS — C7951 Secondary malignant neoplasm of bone: Secondary | ICD-10-CM | POA: Diagnosis not present

## 2016-07-23 NOTE — Telephone Encounter (Signed)
Called patient to ask about coming in earlier for fu on 08-08-16, patient agreed to come in @ 8:30 am

## 2016-07-24 DIAGNOSIS — C772 Secondary and unspecified malignant neoplasm of intra-abdominal lymph nodes: Secondary | ICD-10-CM | POA: Diagnosis not present

## 2016-07-24 DIAGNOSIS — C7951 Secondary malignant neoplasm of bone: Secondary | ICD-10-CM | POA: Diagnosis not present

## 2016-07-24 DIAGNOSIS — C61 Malignant neoplasm of prostate: Secondary | ICD-10-CM | POA: Diagnosis not present

## 2016-07-24 DIAGNOSIS — R339 Retention of urine, unspecified: Secondary | ICD-10-CM | POA: Diagnosis not present

## 2016-07-24 DIAGNOSIS — R630 Anorexia: Secondary | ICD-10-CM | POA: Diagnosis not present

## 2016-07-24 DIAGNOSIS — G893 Neoplasm related pain (acute) (chronic): Secondary | ICD-10-CM | POA: Diagnosis not present

## 2016-07-25 DIAGNOSIS — R339 Retention of urine, unspecified: Secondary | ICD-10-CM | POA: Diagnosis not present

## 2016-07-25 DIAGNOSIS — C772 Secondary and unspecified malignant neoplasm of intra-abdominal lymph nodes: Secondary | ICD-10-CM | POA: Diagnosis not present

## 2016-07-25 DIAGNOSIS — C7951 Secondary malignant neoplasm of bone: Secondary | ICD-10-CM | POA: Diagnosis not present

## 2016-07-25 DIAGNOSIS — C61 Malignant neoplasm of prostate: Secondary | ICD-10-CM | POA: Diagnosis not present

## 2016-07-25 DIAGNOSIS — G893 Neoplasm related pain (acute) (chronic): Secondary | ICD-10-CM | POA: Diagnosis not present

## 2016-07-25 DIAGNOSIS — R630 Anorexia: Secondary | ICD-10-CM | POA: Diagnosis not present

## 2016-07-27 DIAGNOSIS — C772 Secondary and unspecified malignant neoplasm of intra-abdominal lymph nodes: Secondary | ICD-10-CM | POA: Diagnosis not present

## 2016-07-27 DIAGNOSIS — C7951 Secondary malignant neoplasm of bone: Secondary | ICD-10-CM | POA: Diagnosis not present

## 2016-07-27 DIAGNOSIS — R630 Anorexia: Secondary | ICD-10-CM | POA: Diagnosis not present

## 2016-07-27 DIAGNOSIS — G893 Neoplasm related pain (acute) (chronic): Secondary | ICD-10-CM | POA: Diagnosis not present

## 2016-07-27 DIAGNOSIS — R339 Retention of urine, unspecified: Secondary | ICD-10-CM | POA: Diagnosis not present

## 2016-07-27 DIAGNOSIS — C61 Malignant neoplasm of prostate: Secondary | ICD-10-CM | POA: Diagnosis not present

## 2016-07-30 DIAGNOSIS — R630 Anorexia: Secondary | ICD-10-CM | POA: Diagnosis not present

## 2016-07-30 DIAGNOSIS — C7951 Secondary malignant neoplasm of bone: Secondary | ICD-10-CM | POA: Diagnosis not present

## 2016-07-30 DIAGNOSIS — C772 Secondary and unspecified malignant neoplasm of intra-abdominal lymph nodes: Secondary | ICD-10-CM | POA: Diagnosis not present

## 2016-07-30 DIAGNOSIS — R339 Retention of urine, unspecified: Secondary | ICD-10-CM | POA: Diagnosis not present

## 2016-07-30 DIAGNOSIS — C61 Malignant neoplasm of prostate: Secondary | ICD-10-CM | POA: Diagnosis not present

## 2016-07-30 DIAGNOSIS — G893 Neoplasm related pain (acute) (chronic): Secondary | ICD-10-CM | POA: Diagnosis not present

## 2016-07-31 ENCOUNTER — Other Ambulatory Visit: Payer: Medicare Other

## 2016-08-01 DIAGNOSIS — G893 Neoplasm related pain (acute) (chronic): Secondary | ICD-10-CM | POA: Diagnosis not present

## 2016-08-01 DIAGNOSIS — R630 Anorexia: Secondary | ICD-10-CM | POA: Diagnosis not present

## 2016-08-01 DIAGNOSIS — C61 Malignant neoplasm of prostate: Secondary | ICD-10-CM | POA: Diagnosis not present

## 2016-08-01 DIAGNOSIS — R339 Retention of urine, unspecified: Secondary | ICD-10-CM | POA: Diagnosis not present

## 2016-08-01 DIAGNOSIS — C772 Secondary and unspecified malignant neoplasm of intra-abdominal lymph nodes: Secondary | ICD-10-CM | POA: Diagnosis not present

## 2016-08-01 DIAGNOSIS — C7951 Secondary malignant neoplasm of bone: Secondary | ICD-10-CM | POA: Diagnosis not present

## 2016-08-02 ENCOUNTER — Encounter: Payer: Self-pay | Admitting: Infectious Disease

## 2016-08-02 DIAGNOSIS — C7951 Secondary malignant neoplasm of bone: Secondary | ICD-10-CM | POA: Diagnosis not present

## 2016-08-02 DIAGNOSIS — R630 Anorexia: Secondary | ICD-10-CM | POA: Diagnosis not present

## 2016-08-02 DIAGNOSIS — G893 Neoplasm related pain (acute) (chronic): Secondary | ICD-10-CM | POA: Diagnosis not present

## 2016-08-02 DIAGNOSIS — C61 Malignant neoplasm of prostate: Secondary | ICD-10-CM | POA: Diagnosis not present

## 2016-08-02 DIAGNOSIS — R339 Retention of urine, unspecified: Secondary | ICD-10-CM | POA: Diagnosis not present

## 2016-08-02 DIAGNOSIS — C772 Secondary and unspecified malignant neoplasm of intra-abdominal lymph nodes: Secondary | ICD-10-CM | POA: Diagnosis not present

## 2016-08-05 DIAGNOSIS — C61 Malignant neoplasm of prostate: Secondary | ICD-10-CM | POA: Diagnosis not present

## 2016-08-05 DIAGNOSIS — C7951 Secondary malignant neoplasm of bone: Secondary | ICD-10-CM | POA: Diagnosis not present

## 2016-08-05 DIAGNOSIS — G893 Neoplasm related pain (acute) (chronic): Secondary | ICD-10-CM | POA: Diagnosis not present

## 2016-08-05 DIAGNOSIS — R630 Anorexia: Secondary | ICD-10-CM | POA: Diagnosis not present

## 2016-08-05 DIAGNOSIS — C772 Secondary and unspecified malignant neoplasm of intra-abdominal lymph nodes: Secondary | ICD-10-CM | POA: Diagnosis not present

## 2016-08-05 DIAGNOSIS — R339 Retention of urine, unspecified: Secondary | ICD-10-CM | POA: Diagnosis not present

## 2016-08-06 DIAGNOSIS — R339 Retention of urine, unspecified: Secondary | ICD-10-CM | POA: Diagnosis not present

## 2016-08-06 DIAGNOSIS — C772 Secondary and unspecified malignant neoplasm of intra-abdominal lymph nodes: Secondary | ICD-10-CM | POA: Diagnosis not present

## 2016-08-06 DIAGNOSIS — E785 Hyperlipidemia, unspecified: Secondary | ICD-10-CM | POA: Diagnosis not present

## 2016-08-06 DIAGNOSIS — C7951 Secondary malignant neoplasm of bone: Secondary | ICD-10-CM | POA: Diagnosis not present

## 2016-08-06 DIAGNOSIS — Z21 Asymptomatic human immunodeficiency virus [HIV] infection status: Secondary | ICD-10-CM | POA: Diagnosis not present

## 2016-08-06 DIAGNOSIS — C61 Malignant neoplasm of prostate: Secondary | ICD-10-CM | POA: Diagnosis not present

## 2016-08-06 DIAGNOSIS — R630 Anorexia: Secondary | ICD-10-CM | POA: Diagnosis not present

## 2016-08-06 DIAGNOSIS — G893 Neoplasm related pain (acute) (chronic): Secondary | ICD-10-CM | POA: Diagnosis not present

## 2016-08-07 ENCOUNTER — Ambulatory Visit: Payer: Self-pay | Admitting: Urology

## 2016-08-08 ENCOUNTER — Inpatient Hospital Stay: Admission: RE | Admit: 2016-08-08 | Payer: Self-pay | Source: Ambulatory Visit | Admitting: Urology

## 2016-08-08 ENCOUNTER — Inpatient Hospital Stay
Admission: RE | Admit: 2016-08-08 | Discharge: 2016-08-08 | Disposition: A | Discharge status: Home / Self Care | Payer: Self-pay | Source: Ambulatory Visit | Attending: Urology | Admitting: Urology

## 2016-08-08 ENCOUNTER — Encounter: Payer: Self-pay | Admitting: *Deleted

## 2016-08-08 DIAGNOSIS — C772 Secondary and unspecified malignant neoplasm of intra-abdominal lymph nodes: Secondary | ICD-10-CM | POA: Diagnosis not present

## 2016-08-08 DIAGNOSIS — G893 Neoplasm related pain (acute) (chronic): Secondary | ICD-10-CM | POA: Diagnosis not present

## 2016-08-08 DIAGNOSIS — C7951 Secondary malignant neoplasm of bone: Secondary | ICD-10-CM | POA: Diagnosis not present

## 2016-08-08 DIAGNOSIS — C61 Malignant neoplasm of prostate: Secondary | ICD-10-CM | POA: Diagnosis not present

## 2016-08-08 DIAGNOSIS — R630 Anorexia: Secondary | ICD-10-CM | POA: Diagnosis not present

## 2016-08-08 DIAGNOSIS — R339 Retention of urine, unspecified: Secondary | ICD-10-CM | POA: Diagnosis not present

## 2016-08-08 NOTE — Progress Notes (Signed)
0830 Called about follow up appointment this am no answer left message to call back to [phone] and ask for Dwayne Boone.  May have considered hospice was suggested by Dr. Alen Blew 07-18-16.

## 2016-08-09 ENCOUNTER — Other Ambulatory Visit: Payer: Medicare Other

## 2016-08-09 ENCOUNTER — Ambulatory Visit: Payer: Medicare Other | Admitting: Oncology

## 2016-08-12 DIAGNOSIS — R630 Anorexia: Secondary | ICD-10-CM | POA: Diagnosis not present

## 2016-08-12 DIAGNOSIS — R339 Retention of urine, unspecified: Secondary | ICD-10-CM | POA: Diagnosis not present

## 2016-08-12 DIAGNOSIS — C7951 Secondary malignant neoplasm of bone: Secondary | ICD-10-CM | POA: Diagnosis not present

## 2016-08-12 DIAGNOSIS — C772 Secondary and unspecified malignant neoplasm of intra-abdominal lymph nodes: Secondary | ICD-10-CM | POA: Diagnosis not present

## 2016-08-12 DIAGNOSIS — G893 Neoplasm related pain (acute) (chronic): Secondary | ICD-10-CM | POA: Diagnosis not present

## 2016-08-12 DIAGNOSIS — C61 Malignant neoplasm of prostate: Secondary | ICD-10-CM | POA: Diagnosis not present

## 2016-08-13 DIAGNOSIS — R339 Retention of urine, unspecified: Secondary | ICD-10-CM | POA: Diagnosis not present

## 2016-08-13 DIAGNOSIS — C7951 Secondary malignant neoplasm of bone: Secondary | ICD-10-CM | POA: Diagnosis not present

## 2016-08-13 DIAGNOSIS — C772 Secondary and unspecified malignant neoplasm of intra-abdominal lymph nodes: Secondary | ICD-10-CM | POA: Diagnosis not present

## 2016-08-13 DIAGNOSIS — G893 Neoplasm related pain (acute) (chronic): Secondary | ICD-10-CM | POA: Diagnosis not present

## 2016-08-13 DIAGNOSIS — R630 Anorexia: Secondary | ICD-10-CM | POA: Diagnosis not present

## 2016-08-13 DIAGNOSIS — C61 Malignant neoplasm of prostate: Secondary | ICD-10-CM | POA: Diagnosis not present

## 2016-08-14 ENCOUNTER — Ambulatory Visit: Payer: Medicare Other | Admitting: Infectious Disease

## 2016-08-16 DIAGNOSIS — R339 Retention of urine, unspecified: Secondary | ICD-10-CM | POA: Diagnosis not present

## 2016-08-16 DIAGNOSIS — R630 Anorexia: Secondary | ICD-10-CM | POA: Diagnosis not present

## 2016-08-16 DIAGNOSIS — C772 Secondary and unspecified malignant neoplasm of intra-abdominal lymph nodes: Secondary | ICD-10-CM | POA: Diagnosis not present

## 2016-08-16 DIAGNOSIS — C7951 Secondary malignant neoplasm of bone: Secondary | ICD-10-CM | POA: Diagnosis not present

## 2016-08-16 DIAGNOSIS — C61 Malignant neoplasm of prostate: Secondary | ICD-10-CM | POA: Diagnosis not present

## 2016-08-16 DIAGNOSIS — G893 Neoplasm related pain (acute) (chronic): Secondary | ICD-10-CM | POA: Diagnosis not present

## 2016-08-18 DIAGNOSIS — R630 Anorexia: Secondary | ICD-10-CM | POA: Diagnosis not present

## 2016-08-18 DIAGNOSIS — R339 Retention of urine, unspecified: Secondary | ICD-10-CM | POA: Diagnosis not present

## 2016-08-18 DIAGNOSIS — C7951 Secondary malignant neoplasm of bone: Secondary | ICD-10-CM | POA: Diagnosis not present

## 2016-08-18 DIAGNOSIS — C772 Secondary and unspecified malignant neoplasm of intra-abdominal lymph nodes: Secondary | ICD-10-CM | POA: Diagnosis not present

## 2016-08-18 DIAGNOSIS — C61 Malignant neoplasm of prostate: Secondary | ICD-10-CM | POA: Diagnosis not present

## 2016-08-18 DIAGNOSIS — G893 Neoplasm related pain (acute) (chronic): Secondary | ICD-10-CM | POA: Diagnosis not present

## 2016-08-19 DIAGNOSIS — C7951 Secondary malignant neoplasm of bone: Secondary | ICD-10-CM | POA: Diagnosis not present

## 2016-08-19 DIAGNOSIS — G893 Neoplasm related pain (acute) (chronic): Secondary | ICD-10-CM | POA: Diagnosis not present

## 2016-08-19 DIAGNOSIS — C772 Secondary and unspecified malignant neoplasm of intra-abdominal lymph nodes: Secondary | ICD-10-CM | POA: Diagnosis not present

## 2016-08-19 DIAGNOSIS — R630 Anorexia: Secondary | ICD-10-CM | POA: Diagnosis not present

## 2016-08-19 DIAGNOSIS — R339 Retention of urine, unspecified: Secondary | ICD-10-CM | POA: Diagnosis not present

## 2016-08-19 DIAGNOSIS — C61 Malignant neoplasm of prostate: Secondary | ICD-10-CM | POA: Diagnosis not present

## 2016-08-20 DIAGNOSIS — C7951 Secondary malignant neoplasm of bone: Secondary | ICD-10-CM | POA: Diagnosis not present

## 2016-08-20 DIAGNOSIS — C772 Secondary and unspecified malignant neoplasm of intra-abdominal lymph nodes: Secondary | ICD-10-CM | POA: Diagnosis not present

## 2016-08-20 DIAGNOSIS — G893 Neoplasm related pain (acute) (chronic): Secondary | ICD-10-CM | POA: Diagnosis not present

## 2016-08-20 DIAGNOSIS — R630 Anorexia: Secondary | ICD-10-CM | POA: Diagnosis not present

## 2016-08-20 DIAGNOSIS — R339 Retention of urine, unspecified: Secondary | ICD-10-CM | POA: Diagnosis not present

## 2016-08-20 DIAGNOSIS — C61 Malignant neoplasm of prostate: Secondary | ICD-10-CM | POA: Diagnosis not present

## 2016-08-21 DIAGNOSIS — R630 Anorexia: Secondary | ICD-10-CM | POA: Diagnosis not present

## 2016-08-21 DIAGNOSIS — C61 Malignant neoplasm of prostate: Secondary | ICD-10-CM | POA: Diagnosis not present

## 2016-08-21 DIAGNOSIS — C772 Secondary and unspecified malignant neoplasm of intra-abdominal lymph nodes: Secondary | ICD-10-CM | POA: Diagnosis not present

## 2016-08-21 DIAGNOSIS — G893 Neoplasm related pain (acute) (chronic): Secondary | ICD-10-CM | POA: Diagnosis not present

## 2016-08-21 DIAGNOSIS — R339 Retention of urine, unspecified: Secondary | ICD-10-CM | POA: Diagnosis not present

## 2016-08-21 DIAGNOSIS — C7951 Secondary malignant neoplasm of bone: Secondary | ICD-10-CM | POA: Diagnosis not present

## 2016-08-22 DIAGNOSIS — R339 Retention of urine, unspecified: Secondary | ICD-10-CM | POA: Diagnosis not present

## 2016-08-22 DIAGNOSIS — R630 Anorexia: Secondary | ICD-10-CM | POA: Diagnosis not present

## 2016-08-22 DIAGNOSIS — C7951 Secondary malignant neoplasm of bone: Secondary | ICD-10-CM | POA: Diagnosis not present

## 2016-08-22 DIAGNOSIS — C61 Malignant neoplasm of prostate: Secondary | ICD-10-CM | POA: Diagnosis not present

## 2016-08-22 DIAGNOSIS — C772 Secondary and unspecified malignant neoplasm of intra-abdominal lymph nodes: Secondary | ICD-10-CM | POA: Diagnosis not present

## 2016-08-22 DIAGNOSIS — G893 Neoplasm related pain (acute) (chronic): Secondary | ICD-10-CM | POA: Diagnosis not present

## 2016-08-23 DIAGNOSIS — R630 Anorexia: Secondary | ICD-10-CM | POA: Diagnosis not present

## 2016-08-23 DIAGNOSIS — R339 Retention of urine, unspecified: Secondary | ICD-10-CM | POA: Diagnosis not present

## 2016-08-23 DIAGNOSIS — C61 Malignant neoplasm of prostate: Secondary | ICD-10-CM | POA: Diagnosis not present

## 2016-08-23 DIAGNOSIS — C7951 Secondary malignant neoplasm of bone: Secondary | ICD-10-CM | POA: Diagnosis not present

## 2016-08-23 DIAGNOSIS — G893 Neoplasm related pain (acute) (chronic): Secondary | ICD-10-CM | POA: Diagnosis not present

## 2016-08-23 DIAGNOSIS — C772 Secondary and unspecified malignant neoplasm of intra-abdominal lymph nodes: Secondary | ICD-10-CM | POA: Diagnosis not present

## 2016-08-24 DIAGNOSIS — C7951 Secondary malignant neoplasm of bone: Secondary | ICD-10-CM | POA: Diagnosis not present

## 2016-08-24 DIAGNOSIS — C772 Secondary and unspecified malignant neoplasm of intra-abdominal lymph nodes: Secondary | ICD-10-CM | POA: Diagnosis not present

## 2016-08-24 DIAGNOSIS — G893 Neoplasm related pain (acute) (chronic): Secondary | ICD-10-CM | POA: Diagnosis not present

## 2016-08-24 DIAGNOSIS — C61 Malignant neoplasm of prostate: Secondary | ICD-10-CM | POA: Diagnosis not present

## 2016-08-24 DIAGNOSIS — R339 Retention of urine, unspecified: Secondary | ICD-10-CM | POA: Diagnosis not present

## 2016-08-24 DIAGNOSIS — R630 Anorexia: Secondary | ICD-10-CM | POA: Diagnosis not present

## 2016-08-25 DIAGNOSIS — G893 Neoplasm related pain (acute) (chronic): Secondary | ICD-10-CM | POA: Diagnosis not present

## 2016-08-25 DIAGNOSIS — C61 Malignant neoplasm of prostate: Secondary | ICD-10-CM | POA: Diagnosis not present

## 2016-08-25 DIAGNOSIS — R339 Retention of urine, unspecified: Secondary | ICD-10-CM | POA: Diagnosis not present

## 2016-08-25 DIAGNOSIS — C772 Secondary and unspecified malignant neoplasm of intra-abdominal lymph nodes: Secondary | ICD-10-CM | POA: Diagnosis not present

## 2016-08-25 DIAGNOSIS — R630 Anorexia: Secondary | ICD-10-CM | POA: Diagnosis not present

## 2016-08-25 DIAGNOSIS — C7951 Secondary malignant neoplasm of bone: Secondary | ICD-10-CM | POA: Diagnosis not present

## 2016-08-26 ENCOUNTER — Telehealth: Payer: Self-pay | Admitting: Oncology

## 2016-08-26 DIAGNOSIS — G893 Neoplasm related pain (acute) (chronic): Secondary | ICD-10-CM | POA: Diagnosis not present

## 2016-08-26 DIAGNOSIS — R339 Retention of urine, unspecified: Secondary | ICD-10-CM | POA: Diagnosis not present

## 2016-08-26 DIAGNOSIS — C772 Secondary and unspecified malignant neoplasm of intra-abdominal lymph nodes: Secondary | ICD-10-CM | POA: Diagnosis not present

## 2016-08-26 DIAGNOSIS — R630 Anorexia: Secondary | ICD-10-CM | POA: Diagnosis not present

## 2016-08-26 DIAGNOSIS — C61 Malignant neoplasm of prostate: Secondary | ICD-10-CM | POA: Diagnosis not present

## 2016-08-26 DIAGNOSIS — C7951 Secondary malignant neoplasm of bone: Secondary | ICD-10-CM | POA: Diagnosis not present

## 2016-09-06 NOTE — Telephone Encounter (Signed)
[name] form hospice called to let us know that Dwayne Boone passed away at home on 08-27-2016 at 7:47am

## 2016-09-06 DEATH — deceased

## 2016-10-12 ENCOUNTER — Other Ambulatory Visit: Payer: Self-pay | Admitting: Nurse Practitioner

## 2018-07-27 IMAGING — NM NM BONE WHOLE BODY
2 series · 2 of 2 positions shown · non-contrast
Comparison: CT scan same day.

CLINICAL DATA: Prostate cancer.

EXAM:
NUCLEAR MEDICINE WHOLE BODY BONE SCAN
TECHNIQUE: Whole body anterior and posterior images were obtained approximately
3 hours after intravenous injection of radiopharmaceutical.
RADIOPHARMACEUTICALS:  21.0 MCi Dechnetium-BBm MDP IV

[Series 1: whole body · 2.66mm/px · 1 of 1 slices shown (1 of 2)]
[im 1/1]
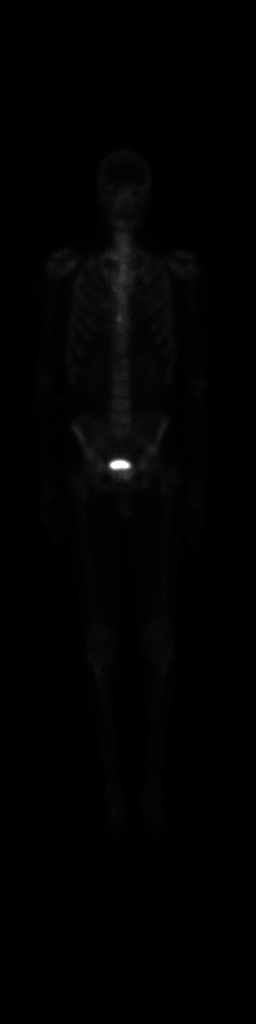

[Series 1: whole body · 2.66mm/px · 1 of 1 slices shown (2 of 2)]
[im 1/1]
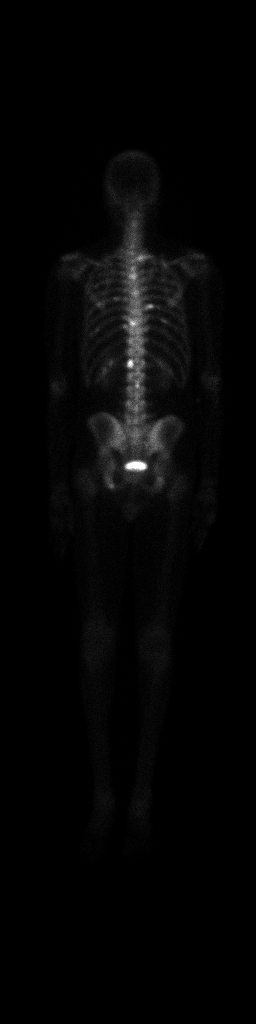

[2 of 2 positions shown; findings below may reference images not displayed]

FINDINGS: There are numerous areas of increased uptake most notably in the
spine, ribs and pelvis consistent with metastatic prostate cancer.
No hip lesions or extremity lesions.
IMPRESSION: Numerous metastatic bone lesions involving the axial skeleton.
